# Patient Record
Sex: Male | Born: 1970 | Hispanic: No | Marital: Married | Smoking: Never smoker
Health system: Southern US, Community
[De-identification: ages and names within clinical notes are randomized; demographics above are authoritative.]

## PROBLEM LIST (undated history)

## (undated) DIAGNOSIS — F329 Major depressive disorder, single episode, unspecified: Secondary | ICD-10-CM

## (undated) DIAGNOSIS — N189 Chronic kidney disease, unspecified: Secondary | ICD-10-CM

## (undated) DIAGNOSIS — F32A Depression, unspecified: Secondary | ICD-10-CM

## (undated) DIAGNOSIS — K219 Gastro-esophageal reflux disease without esophagitis: Secondary | ICD-10-CM

## (undated) DIAGNOSIS — N201 Calculus of ureter: Secondary | ICD-10-CM

## (undated) HISTORY — DX: Major depressive disorder, single episode, unspecified: F32.9

## (undated) HISTORY — PX: HEMORRHOID SURGERY: SHX153

## (undated) HISTORY — DX: Chronic kidney disease, unspecified: N18.9

## (undated) HISTORY — DX: Depression, unspecified: F32.A

---

## 2014-11-08 ENCOUNTER — Emergency Department (HOSPITAL_BASED_OUTPATIENT_CLINIC_OR_DEPARTMENT_OTHER): Payer: Medicaid Other

## 2014-11-08 ENCOUNTER — Encounter (HOSPITAL_BASED_OUTPATIENT_CLINIC_OR_DEPARTMENT_OTHER): Payer: Self-pay | Admitting: *Deleted

## 2014-11-08 ENCOUNTER — Emergency Department (HOSPITAL_BASED_OUTPATIENT_CLINIC_OR_DEPARTMENT_OTHER)
Admission: EM | Admit: 2014-11-08 | Discharge: 2014-11-09 | Disposition: A | Discharge status: Home / Self Care | Payer: Medicaid Other | Attending: Emergency Medicine | Admitting: Emergency Medicine

## 2014-11-08 DIAGNOSIS — M25512 Pain in left shoulder: Secondary | ICD-10-CM | POA: Diagnosis present

## 2014-11-08 DIAGNOSIS — M5412 Radiculopathy, cervical region: Secondary | ICD-10-CM | POA: Insufficient documentation

## 2014-11-08 NOTE — ED Notes (Signed)
Pt c/o chest pain x 2 years worse x 1 day

## 2014-11-08 NOTE — ED Notes (Signed)
C/o left chest shoulder and arm pain x 2 years,  Getting worse, states pain is increased w movement,  States diff raising left arm at times due to pain

## 2014-11-08 NOTE — ED Provider Notes (Addendum)
CSN: [id number]     Ar the patient is not able to localize his pain to a specific dermatomerival date & time 11/08/14  2308 History  This chart was scribed for Paula LibraJohn Zadie Deemer, MD by Leona CarryG. Clay Sherrill, ED Scribe. The patient was seen in [facility]. The patient's care was started at 11:53 PM.     Chief Complaint  Patient presents with  . Shoulder Pain   HPI HPI Comments: Marc Tate is a 44 y.o. male who presents to the Emergency Department complaining of left shoulder pain beginning two years ago. Patient reports that the pain radiates to the left upper part of his chest, the left upper part of his neck and down his left arm.  pain is worse with movement of the left arm or movement of the neck. He describes the pain as feeling like muscle spasms. He states that the pain became more severe two weeks ago and is becoming progressively worse. Patient denies numbness in fingers.  [place].  History reviewed. No pertinent past medical history. Past Surgical History  Procedure Laterality Date  . Hemorrhoid surgery     History reviewed. No pertinent family history. History  Substance Use Topics  . Smoking status: Never Smoker   . Smokeless tobacco: Not on file  . Alcohol Use: No    Review of Systems  All other systems reviewed and are negative.      Allergies  Review of patient's allergies indicates no known allergies.  Home Medications   Prior to Admission medications   Not on File   Triage Vitals: BP 147/75 mmHg  Pulse 60  Temp(Src) 98.3 F (36.8 C)  Resp 18  Ht 5\' 8"  (1.727 m)  Wt 184 lb (83.462 kg)  BMI 27.98 kg/m2 Physical Exam General: Well-developed, well-nourished male in no acute distress; appearance consistent with age of record HENT: normocephalic; atraumatic Eyes: pupils equal, round and reactive to light; extraocular muscles intact Neck: supple; forward flexion or rotation to the right causes the pain Heart: regular rate  and rhythm; no murmurs, rubs or gallops Lungs: clear to auscultation bilaterally Abdomen: soft; nondistended; nontender; no masses or hepatosplenomegaly; bowel sounds present Extremities: No deformity; full range of motion; pulses normal; pain on range of motion of left shoulder, notably abduction and internal rotation  Neurologic: Awake, alert and oriented; motor function intact in all extremities and symmetric; sensation intact in the upper extremities and symmetric; no facial droop Skin: Warm and dry Psychiatric: Normal mood and affect     ED Course  Procedures (including critical care time) DIAGNOSTIC STUDIES:   COORDINATION OF CARE:  DDX includes cervical radiculopathy and rotator cuff syndrome. I favor a diagnosis of cervical radiculopathy even though the patient is not able to localize the pain to a specific dermatome.     EKG Interpretation   Date/Time:  Monday Nov 08 2014 23:21:44 EDT Ventricular Rate:  64 PR Interval:  134 QRS Duration: 94 QT Interval:  364 QTC Calculation: 375 R Axis:   31 Text Interpretation:  Normal sinus rhythm Normal ECG No previous ECGs  available Confirmed by Barnes Florek  MD, Jonny RuizJOHN ([id number]) on 11/08/2014 11:41:32 PM      MDM  Nursing notes and vitals signs, including pulse oximetry, reviewed.  Summary of this visit's results, reviewed by myself:  Imaging Studies: Dg Chest 2 View  11/09/2014   CLINICAL DATA:  Chronic chest pain, worse on the left. Pain radiates down the left arm and  back. Initial encounter.  EXAM: CHEST  2 VIEW  COMPARISON:  None.  FINDINGS: The lungs are well-aerated and clear. There is no evidence of focal opacification, pleural effusion or pneumothorax.  The heart is normal in size; the mediastinal contour is within normal limits. No acute osseous abnormalities are seen.  IMPRESSION: No acute cardiopulmonary process seen.   Electronically Signed   By: Roanna Raider M.D.   On: 11/09/2014 00:25   I personally performed the  services described in this documentation, which was scribed in my presence. The recorded information has been reviewed and is accurate.   Paula Libra, MD 11/09/14 1610  Paula Libra, MD 11/09/14 [id number]

## 2014-11-09 MED ORDER — CYCLOBENZAPRINE HCL 10 MG PO TABS: 10.0000 mg | ORAL_TABLET | Freq: Three times a day (TID) | ORAL | Status: DC | PRN

## 2014-11-09 MED ORDER — NAPROXEN 250 MG PO TABS
500.0000 mg | ORAL_TABLET | Freq: Once | ORAL | Status: AC
  Administered 2014-11-09: 500 mg via ORAL
  Filled 2014-11-09: qty 2

## 2014-11-09 MED ORDER — NAPROXEN SODIUM 550 MG PO TABS: ORAL_TABLET | ORAL | Status: DC

## 2014-11-09 MED ORDER — CYCLOBENZAPRINE HCL 10 MG PO TABS
10.0000 mg | ORAL_TABLET | Freq: Once | ORAL | Status: AC
  Administered 2014-11-09: 10 mg via ORAL
  Filled 2014-11-09: qty 1

## 2015-01-10 ENCOUNTER — Other Ambulatory Visit: Payer: Self-pay | Admitting: Infectious Disease

## 2015-01-10 ENCOUNTER — Ambulatory Visit
Admission: RE | Admit: 2015-01-10 | Discharge: 2015-01-10 | Disposition: A | Discharge status: Home / Self Care | Payer: No Typology Code available for payment source | Source: Ambulatory Visit | Attending: Infectious Disease | Admitting: Infectious Disease

## 2015-01-10 DIAGNOSIS — R7611 Nonspecific reaction to tuberculin skin test without active tuberculosis: Secondary | ICD-10-CM

## 2016-09-10 ENCOUNTER — Ambulatory Visit (INDEPENDENT_AMBULATORY_CARE_PROVIDER_SITE_OTHER): Payer: BLUE CROSS/BLUE SHIELD | Admitting: Family Medicine

## 2016-09-10 ENCOUNTER — Ambulatory Visit (INDEPENDENT_AMBULATORY_CARE_PROVIDER_SITE_OTHER): Payer: BLUE CROSS/BLUE SHIELD

## 2016-09-10 VITALS — BP 126/78 | HR 70 | Temp 97.8°F | Resp 14 | Ht 67.0 in | Wt 190.0 lb

## 2016-09-10 DIAGNOSIS — R1010 Upper abdominal pain, unspecified: Secondary | ICD-10-CM

## 2016-09-10 DIAGNOSIS — R079 Chest pain, unspecified: Secondary | ICD-10-CM

## 2016-09-10 DIAGNOSIS — Z789 Other specified health status: Secondary | ICD-10-CM | POA: Diagnosis not present

## 2016-09-10 DIAGNOSIS — Z87442 Personal history of urinary calculi: Secondary | ICD-10-CM

## 2016-09-10 DIAGNOSIS — R12 Heartburn: Secondary | ICD-10-CM

## 2016-09-10 DIAGNOSIS — R109 Unspecified abdominal pain: Secondary | ICD-10-CM

## 2016-09-10 DIAGNOSIS — F329 Major depressive disorder, single episode, unspecified: Secondary | ICD-10-CM

## 2016-09-10 DIAGNOSIS — N200 Calculus of kidney: Secondary | ICD-10-CM | POA: Diagnosis not present

## 2016-09-10 LAB — POCT URINALYSIS DIP (MANUAL ENTRY)
BILIRUBIN UA: NEGATIVE
BILIRUBIN UA: NEGATIVE
GLUCOSE UA: NEGATIVE
LEUKOCYTES UA: NEGATIVE
Nitrite, UA: NEGATIVE
Protein Ur, POC: NEGATIVE
SPEC GRAV UA: 1.025 (ref 1.030–1.035)
Urobilinogen, UA: 0.2 (ref ?–2.0)
pH, UA: 5.5 (ref 5.0–8.0)

## 2016-09-10 LAB — POC MICROSCOPIC URINALYSIS (UMFC): MUCUS RE: ABSENT

## 2016-09-10 MED ORDER — OMEPRAZOLE 20 MG PO CPDR: 20.0000 mg | DELAYED_RELEASE_CAPSULE | Freq: Every day | ORAL | 1 refills | Status: DC

## 2016-09-10 NOTE — Progress Notes (Signed)
By signing my name below, I, Marc Tate, attest that this documentation has been prepared under the direction and in the presence of Marc Staggers, MD.  Electronically Signed: [name], Medical Scribe. 09/10/16. 2:56 PM.  Subjective:    Patient ID: Marc Tate, male    DOB: 01-13-1971, 46 y.o.   MRN: [id number]  HPI Chief Complaint  Patient presents with  . Flank Pain  . Depression    states sometimes has depression    HPI Comments: Ruffner Marc is a 46 y.o. male who presents to the Primary Care at [facility] complaining of worsening intermittent left flank pain onset over 4 months, not felt today. Pt's native language is Arabic. He brought in his cousin to translate for him and stratus interpreter [phone] was used. Flank pain has been increasing in frequency and last longer when it occurs and worsens after standing without minimal movement during his 4 hour shift. Reports associated sxs of intermittent darker yellow urine (no blood), dysuria, chest pain onset for 8 years (not felt today), SOB (not during chest pain and not felt today). He mentions his SOB does not occur at the same time of his chest pain. Took ibuprofen, OTC gas relief PRN, and OTC colorful antiacids 1-2 x a week for some relief of his sxs. He reports GERD, and feeling gas moving in his abdomen and suspects he has gas in his GI. Pt had colonoscopy while under anesthesia. Pt was told he had muscle spasms located in his left upper lower abdomen and radiates to his chest area that feels similar to a pulsing "heart beat". Pt had a kidney sone 15 years ago and notes having a urine problem that was related to his prostate while in [place] - pt doesn't mention the onset, treatment, or other descriptive factors. He reports unrelated shoulder pain and hunger even after he's finished his meal. Denies hematuria, bloody stool, melena, fever, nausea, emesis, light-headedness, and PMHx/FHx of MI/heart  disease.  Depression: Pt had counseling with medication for 3 months when he was in [place] as a refugee and found a lot of relief of his dysphoric mood. Pt reports it has allowed him to go to work and to do his daily activities. Pt felt better after moving to the [place] but for the past 2 months after living here with a language barrier, relying on other people/not having his independence, and the cultural difference has caused his depression to come back. Denies SI, thoughts of self harm, or thoughts of hurting others.  Depression screen [facility] 2/9 09/10/2016 09/10/2016 09/10/2016  Decreased Interest 1 1 0  Down, Depressed, Hopeless 1 - 1  PHQ - 2 Score Altered sleeping 1 - -  Tired, decreased energy 1 - -  Change in appetite 1 - -  Feeling bad or failure about yourself  1 - -  Trouble concentrating 1 - -  Moving slowly or fidgety/restless 1 - -  Suicidal thoughts 0 - -  PHQ-9 Score 8 - -  Difficult doing work/chores Very difficult - -    There are no active problems to display for this patient.  No past medical history on file. No past surgical history on file. No Known Allergies Prior to Admission medications   Not on File   Social History   Social History  . Marital status: Married    Spouse name: N/A  . Number of children: N/A  . Years of education: N/A  Occupational History  . Not on file.   Social History Main Topics  . Smoking status: Never Smoker  . Smokeless tobacco: Never Used  . Alcohol use Not on file  . Drug use: Unknown  . Sexual activity: Not on file   Other Topics Concern  . Not on file   Social History Narrative  . No narrative on file   Review of Systems  Constitutional: Negative for fever.  Respiratory: Positive for shortness of breath.   Cardiovascular: Positive for chest pain.  Gastrointestinal: Positive for abdominal distention. Negative for abdominal pain, blood in stool, nausea and vomiting.  Genitourinary: Positive for dysuria and  flank pain. Negative for hematuria.  Musculoskeletal: Positive for arthralgias.  Neurological: Negative for light-headedness.  Psychiatric/Behavioral: Positive for dysphoric mood. Negative for self-injury and suicidal ideas.   Objective:  Physical Exam  Constitutional: He is oriented to person, place, and time. He appears well-developed and well-nourished.  HENT:  Head: Normocephalic and atraumatic.  Eyes: EOM are normal. Pupils are equal, round, and reactive to light.  Neck: No JVD present. Carotid bruit is not present.  Cardiovascular: Normal rate, regular rhythm and normal heart sounds.  Exam reveals no gallop and no friction rub.   No murmur heard. Pulmonary/Chest: Effort normal and breath sounds normal. No respiratory distress. He has no wheezes. He has no rales. He exhibits no tenderness.  Abdominal: Soft. Bowel sounds are normal. There is tenderness (minimal) in the left lower quadrant. There is no CVA tenderness, no tenderness at McBurney's point and negative Murphy's sign.  Musculoskeletal: He exhibits no edema.  Skin intact No rash  Neurological: He is alert and oriented to person, place, and time.  Skin: Skin is warm, dry and intact. No rash noted.  Psychiatric: He has a normal mood and affect.  Vitals reviewed.   Vitals:   09/10/16 1410  BP: 126/78  Pulse: 70  Resp: 14  Temp: 97.8 F (36.6 C)  SpO2: 100%  Weight: 190 lb (86.2 kg)  Height: 5\' 7"  (1.702 m)  Body mass index is 29.76 kg/m.   Results for orders placed or performed in visit on 09/10/16  POCT urinalysis dipstick  Result Value Ref Range   Color, UA yellow yellow   Clarity, UA clear clear   Glucose, UA negative negative   Bilirubin, UA negative negative   Ketones, POC UA negative negative   Spec Grav, UA 1.025 1.030 - 1.035   Blood, UA trace-intact (A) negative   pH, UA 5.5 5.0 - 8.0   Protein Ur, POC negative negative   Urobilinogen, UA 0.2 Negative - 2.0   Nitrite, UA Negative Negative    Leukocytes, UA Negative Negative  POCT Microscopic Urinalysis ([facility])  Result Value Ref Range   WBC,UR,HPF,POC None None WBC/hpf   RBC,UR,HPF,POC None None RBC/hpf   Bacteria None None, Too numerous to count   Mucus Absent Absent   Epithelial Cells, UR Per Microscopy None None, Too numerous to count cells/hpf   Dg Abd 1 View  Result Date: 09/10/2016 CLINICAL DATA:  Initial evaluation for acute left flank pain. EXAM: ABDOMEN - 1 VIEW COMPARISON:  None. FINDINGS: Bowel gas pattern within normal limits without evidence for obstruction or ileus. Faint calcific density measuring approximately 8 mm overlies the region of the left renal shadow, which may reflect nephrolithiasis. No other radiopaque calcifications seen overlying either kidney or along the expected course of either ureter. There is a 5 mm calcific density overlying the left aspect of the bladder  in the left hemipelvis. While this may reflect a vascular phlebolith. Possible small stone near the left UVJ is not entirely excluded. Adjacent calcification just laterally favored to be vascular in nature as this lies outside the bladder shadow. Visualized osseous structures within normal limits. IMPRESSION: 1. 5 mm calcific density overlying the left aspect of the bladder. While this may reflect a vascular phlebolith, possible stone positioned near the left UVJ could also be considered. 2. Amorphous 8 mm calcific density overlying the left renal shadow, suspicious for left renal nephrolithiasis. 3. Nonobstructive bowel gas pattern. Electronically Signed   By: Rise Mu M.D.   On: 09/10/2016 15:56   Assessment & Plan:    Bjorn Hallas is a 46 y.o. male Flank pain - Plan: POCT urinalysis dipstick, POCT Microscopic Urinalysis ([facility]), DG Abd 1 View, Ambulatory referral to Urology Left flank pain - Plan: DG Abd 1 View History of nephrolithiasis - Plan: DG Abd 1 View, Ambulatory referral to Urology Nephrolith - Plan: Ambulatory referral to  Urology  - Nephrolith seen on x-ray, urinalysis with possible blood, but not seen on micro-. Minimal symptoms at present, doubt obstructive nephrolith.  - Fluids, refer to urology for further evaluation, but RTC/ER precautions discussed if acute worsening pain that may represent obstructive nephrolith  Language barrier  - Video interpreter used with understanding expressed  Chest pain, unspecified type - Plan: EKG 12-Lead  - Rare intermittent symptoms. May be related to heartburn. Trial of omeprazole 20 mg daily, but ER/RTC precautions discussed for chest pain.  Heartburn - Plan: omeprazole (PRILOSEC) 20 MG capsule Upper abdominal pain - Plan: Comprehensive metabolic panel, Lipase, omeprazole (PRILOSEC) 20 MG capsule, CBC  - Check CMP, lipase, CBC. Start PPI. If symptoms persist, may need CT imaging for both nephrolith and to  evaluate abdominal pain further. Also consider gastroenterology eval if persistent lower abdominal pain.  Reactive depression  - Previous depression, now with return of symptoms past few months. Suspect situational with culture change, language barrier and social stressors. Number provided for counseling.  Requested repeat visit in 1 week to recheck some of symptoms above and to assess improvement.   Meds ordered this encounter  Medications  . omeprazole (PRILOSEC) 20 MG capsule    Sig: Take 1 capsule (20 mg total) by mouth daily.    Dispense:  30 capsule    Refill:  1   Patient Instructions   Your x-ray does indicate likely kidney stones. This may cause flank pain and abdominal pain, so we'll refer you to a urologist to discuss treatment of those stones. If you have acute change or worsening of that pain, return here or the emergency room.  Start prilosec once per day to see if that helps possible heartburn, chest symptoms and abdominal pain. I will check some bloodwork to look at liver and pancreas tests, and recheck in next 1 week.  If pain is not improving in  next week with heartburn medicine, you may need to have cat scan to look at causes of pain further.  Return to the clinic or go to the nearest emergency room if any of your symptoms worsen or new symptoms occur.  Please return to discuss chest pains further, but if from heartburn - that should also improve with prilosec once per day. Return to the clinic or go to the nearest emergency room if any of your symptoms worsen or new symptoms occur.  For depression symptoms, would recommend meeting with a counselor. You may also need to  be back on medication, but would like to try counseling first. Please follow up to discuss this further in next few weeks.  Counseling:  [facility] [place]: [phone]   Kidney Stones Kidney stones (urolithiasis) are solid, rock-like deposits that form inside of the organs that make urine (kidneys). A kidney stone may form in a kidney and move into the bladder, where it can cause intense pain and block the flow of urine. Kidney stones are created when high levels of certain minerals are found in the urine. They are usually passed through urination, but in some cases, medical treatment may be needed to remove them. What are the causes? Kidney stones may be caused by:  A condition in which certain glands produce too much parathyroid hormone (primary hyperparathyroidism), which causes too much calcium buildup in the blood.  Buildup of uric acid crystals in the bladder (hyperuricosuria). Uric acid is a chemical that the body produces when you eat certain foods. It usually exits the body in the urine.  Narrowing (stricture) of one or both of the tubes that drain urine from the kidneys to the bladder (ureters).  A kidney blockage that is present at birth (congenital obstruction).  Past surgery on the kidney or the ureters, such as gastric bypass surgery. What increases the risk? The following factors make you more likely to develop kidney  stones:  Having had a kidney stone in the past.  Having a family history of kidney stones.  Not drinking enough water.  Eating a diet that is high in protein, salt (sodium), or sugar.  Being overweight or obese. What are the signs or symptoms? Symptoms of a kidney stone may include:  Nausea.  Vomiting.  Blood in the urine (hematuria).  Pain in the side of the abdomen, right below the ribs (flank pain). Pain usually spreads (radiates) to the groin.  Needing to urinate frequently or urgently. How is this diagnosed? This condition may be diagnosed based on:  Your medical history.  A physical exam.  Blood tests.  Urine tests.  CT scan.  Abdominal X-ray.  A procedure to examine the inside of the bladder (cystoscopy). How is this treated? Treatment for kidney stones depends on the size, location, and makeup of the stones. Treatment may involve:  Analyzing your urine before and after you pass the stone through urination.  Being monitored at the hospital until you pass the stone through urination.  Increasing your fluid intake and decreasing the amount of calcium and protein in your diet.  A procedure to break up kidney stones in the bladder using:  A focused beam of light (laser therapy).  Shock waves (extracorporeal shock wave lithotripsy).  Surgery to remove kidney stones. This may be needed if you have severe pain or have stones that block your urinary tract. Follow these instructions at home: Eating and drinking    Drink enough fluid to keep your urine clear or pale yellow. This will help you to pass the kidney stone.  If directed, change your diet. This may include:  Limiting how much sodium you eat.  Eating more fruits and vegetables.  Limiting how much meat, poultry, fish, and eggs you eat.  Follow instructions from your health care provider about eating or drinking restrictions. General instructions   Collect urine samples as told by your  health care provider. You may need to collect a urine sample:  24 hours after you pass the stone.  8-12 weeks after passing the kidney stone, and every  6-12 months after that.  Strain your urine every time you urinate, for as long as directed. Use the strainer that your health care provider recommends.  Do not throw out the kidney stone after passing it. Keep the stone so it can be tested by your health care provider. Testing the makeup of your kidney stone may help prevent you from getting kidney stones in the future.  Take over-the-counter and prescription medicines only as told by your health care provider.  Keep all follow-up visits as told by your health care provider. This is important. You may need follow-up X-rays or ultrasounds to make sure that your stone has passed. How is this prevented? To prevent another kidney stone:  Drink enough fluid to keep your urine clear or pale yellow. This is the best way to prevent kidney stones.  Eat a healthy diet and follow recommendations from your health care provider about foods to avoid. You may be instructed to eat a low-protein diet. Recommendations vary depending on the type of kidney stone that you have.  Maintain a healthy weight. Contact a health care provider if:  You have pain that gets worse or does not get better with medicine. Get help right away if:  You have a fever or chills.  You develop severe pain.  You develop new abdominal pain.  You faint.  You are unable to urinate. This information is not intended to replace advice given to you by your health care provider. Make sure you discuss any questions you have with your health care provider. Document Released: 05/28/2005 Document Revised: 12/16/2015 Document Reviewed: 11/11/2015 Elsevier Interactive Patient Education  2017 Elsevier Inc.   Nonspecific Chest Pain Chest pain can be caused by many different conditions. There is always a chance that your pain could be  related to something serious, such as a heart attack or a blood clot in your lungs. Chest pain can also be caused by conditions that are not life-threatening. If you have chest pain, it is very important to follow up with your health care provider. What are the causes? Causes of this condition include:  Heartburn.  Pneumonia or bronchitis.  Anxiety or stress.  Inflammation around your heart (pericarditis) or lung (pleuritis or pleurisy).  A blood clot in your lung.  A collapsed lung (pneumothorax). This can develop suddenly on its own (spontaneous pneumothorax) or from trauma to the chest.  Shingles infection (varicella-zoster virus).  Heart attack.  Damage to the bones, muscles, and cartilage that make up your chest wall. This can include:  Bruised bones due to injury.  Strained muscles or cartilage due to frequent or repeated coughing or overwork.  Fracture to one or more ribs.  Sore cartilage due to inflammation (costochondritis). What increases the risk? Risk factors for this condition may include:  Activities that increase your risk for trauma or injury to your chest.  Respiratory infections or conditions that cause frequent coughing.  Medical conditions or overeating that can cause heartburn.  Heart disease or family history of heart disease.  Conditions or health behaviors that increase your risk of developing a blood clot.  Having had chicken pox (varicella zoster). What are the signs or symptoms? Chest pain can feel like:  Burning or tingling on the surface of your chest or deep in your chest.  Crushing, pressure, aching, or squeezing pain.  Dull or sharp pain that is worse when you move, cough, or take a deep breath.  Pain that is also felt in your back, neck,  shoulder, or arm, or pain that spreads to any of these areas. Your chest pain may come and go, or it may stay constant. How is this diagnosed? Lab tests or other studies may be needed to find the  cause of your pain. Your health care provider may have you take a test called an ECG (electrocardiogram). An ECG records your heartbeat patterns at the time the test is performed. You may also have other tests, such as:  Transthoracic echocardiogram (TTE). In this test, sound waves are used to create a picture of the heart structures and to look at how blood flows through your heart.  Transesophageal echocardiogram (TEE).This is a more advanced imaging test that takes images from inside your body. It allows your health care provider to see your heart in finer detail.  Cardiac monitoring. This allows your health care provider to monitor your heart rate and rhythm in real time.  Holter monitor. This is a portable device that records your heartbeat and can help to diagnose abnormal heartbeats. It allows your health care provider to track your heart activity for several days, if needed.  Stress tests. These can be done through exercise or by taking medicine that makes your heart beat more quickly.  Blood tests.  Other imaging tests. How is this treated? Treatment depends on what is causing your chest pain. Treatment may include:  Medicines. These may include:  Acid blockers for heartburn.  Anti-inflammatory medicine.  Pain medicine for inflammatory conditions.  Antibiotic medicine, if an infection is present.  Medicines to dissolve blood clots.  Medicines to treat coronary artery disease (CAD).  Supportive care for conditions that do not require medicines. This may include:  Resting.  Applying heat or cold packs to injured areas.  Limiting activities until pain decreases. Follow these instructions at home: Medicines   If you were prescribed an antibiotic, take it as told by your health care provider. Do not stop taking the antibiotic even if you start to feel better.  Take over-the-counter and prescription medicines only as told by your health care provider. Lifestyle   Do  not use any products that contain nicotine or tobacco, such as cigarettes and e-cigarettes. If you need help quitting, ask your health care provider.  Do not drink alcohol.  Make lifestyle changes as directed by your health care provider. These may include:  Getting regular exercise. Ask your health care provider to suggest some activities that are safe for you.  Eating a heart-healthy diet. A registered dietitian can help you to learn healthy eating options.  Maintaining a healthy weight.  Managing diabetes, if necessary.  Reducing stress, such as with yoga or relaxation techniques. General instructions   Avoid any activities that bring on chest pain.  If heartburn is the cause for your chest pain, raise (elevate) the head of your bed about 6 inches (15 cm) by putting blocks under the legs. Sleeping with more pillows does not effectively relieve heartburn because it only changes the position of your head.  Keep all follow-up visits as told by your health care provider. This is important. This includes any further testing if your chest pain does not go away. Contact a health care provider if:  Your chest pain does not go away.  You have a rash with blisters on your chest.  You have a fever.  You have chills. Get help right away if:  Your chest pain is worse.  You have a cough that gets worse, or you cough up  blood.  You have severe pain in your abdomen.  You have severe weakness.  You faint.  You have sudden, unexplained chest discomfort.  You have sudden, unexplained discomfort in your arms, back, neck, or jaw.  You have shortness of breath at any time.  You suddenly start to sweat, or your skin gets clammy.  You feel nauseous or you vomit.  You suddenly feel light-headed or dizzy.  Your heart begins to beat quickly, or it feels like it is skipping beats. These symptoms may represent a serious problem that is an emergency. Do not wait to see if the symptoms  will go away. Get medical help right away. Call your local emergency services (911 in the [place] Do not drive yourself to the hospital. This information is not intended to replace advice given to you by your health care provider. Make sure you discuss any questions you have with your health care provider. Document Released: 03/07/2005 Document Revised: 02/20/2016 Document Reviewed: 02/20/2016 Elsevier Interactive Patient Education  2017 Elsevier Inc.    Abdominal Pain, Adult Abdominal pain can be caused by many things. Often, abdominal pain is not serious and it gets better with no treatment or by being treated at home. However, sometimes abdominal pain is serious. Your health care provider will do a medical history and a physical exam to try to determine the cause of your abdominal pain. Follow these instructions at home:  Take over-the-counter and prescription medicines only as told by your health care provider. Do not take a laxative unless told by your health care provider.  Drink enough fluid to keep your urine clear or pale yellow.  Watch your condition for any changes.  Keep all follow-up visits as told by your health care provider. This is important. Contact a health care provider if:  Your abdominal pain changes or gets worse.  You are not hungry or you lose weight without trying.  You are constipated or have diarrhea for more than 2-3 days.  You have pain when you urinate or have a bowel movement.  Your abdominal pain wakes you up at night.  Your pain gets worse with meals, after eating, or with certain foods.  You are throwing up and cannot keep anything down.  You have a fever. Get help right away if:  Your pain does not go away as soon as your health care provider told you to expect.  You cannot stop throwing up.  Your pain is only in areas of the abdomen, such as the right side or the left lower portion of the abdomen.  You have bloody or black stools, or  stools that look like tar.  You have severe pain, cramping, or bloating in your abdomen.  You have signs of dehydration, such as:  Dark urine, very little urine, or no urine.  Cracked lips.  Dry mouth.  Sunken eyes.  Sleepiness.  Weakness. This information is not intended to replace advice given to you by your health care provider. Make sure you discuss any questions you have with your health care provider. Document Released: 03/07/2005 Document Revised: 12/16/2015 Document Reviewed: 11/09/2015 Elsevier Interactive Patient Education  2017 [employer].   IF you received an x-ray today, you will receive an invoice from [facility]. Please contact [facility] at [phone] with questions or concerns regarding your invoice.   IF you received labwork today, you will receive an invoice from [facility]. Please contact [facility] at [phone] with questions or concerns regarding your invoice.   Our  billing staff will not be able to assist you with questions regarding bills from these companies.  You will be contacted with the lab results as soon as they are available. The fastest way to get your results is to activate your My Chart account. Instructions are located on the last page of this paperwork. If you have not heard from [place] regarding the results in 2 weeks, please contact this office.      I personally performed the services described in this documentation, which was scribed in my presence. The recorded information has been reviewed and considered for accuracy and completeness, addended by me as needed, and agree with information above.  Signed,   Marc Staggers, MD [facility].  09/10/16 6:34 PM

## 2016-09-10 NOTE — Patient Instructions (Addendum)
Your x-ray does indicate likely kidney stones. This may cause flank pain and abdominal pain, so we'll refer you to a urologist to discuss treatment of those stones. If you have acute change or worsening of that pain, return here or the emergency room.  Start prilosec once per day to see if that helps possible heartburn, chest symptoms and abdominal pain. I will check some bloodwork to look at liver and pancreas tests, and recheck in next 1 week.  If pain is not improving in next week with heartburn medicine, you may need to have cat scan to look at causes of pain further.  Return to the clinic or go to the nearest emergency room if any of your symptoms worsen or new symptoms occur.  Please return to discuss chest pains further, but if from heartburn - that should also improve with prilosec once per day. Return to the clinic or go to the nearest emergency room if any of your symptoms worsen or new symptoms occur.  For depression symptoms, would recommend meeting with a counselor. You may also need to be back on medication, but would like to try counseling first. Please follow up to discuss this further in next few weeks.  Counseling:  [facility] [place]: [phone]   Kidney Stones Kidney stones (urolithiasis) are solid, rock-like deposits that form inside of the organs that make urine (kidneys). A kidney stone may form in a kidney and move into the bladder, where it can cause intense pain and block the flow of urine. Kidney stones are created when high levels of certain minerals are found in the urine. They are usually passed through urination, but in some cases, medical treatment may be needed to remove them. What are the causes? Kidney stones may be caused by:  A condition in which certain glands produce too much parathyroid hormone (primary hyperparathyroidism), which causes too much calcium buildup in the blood.  Buildup of uric acid crystals in the bladder (hyperuricosuria).  Uric acid is a chemical that the body produces when you eat certain foods. It usually exits the body in the urine.  Narrowing (stricture) of one or both of the tubes that drain urine from the kidneys to the bladder (ureters).  A kidney blockage that is present at birth (congenital obstruction).  Past surgery on the kidney or the ureters, such as gastric bypass surgery. What increases the risk? The following factors make you more likely to develop kidney stones:  Having had a kidney stone in the past.  Having a family history of kidney stones.  Not drinking enough water.  Eating a diet that is high in protein, salt (sodium), or sugar.  Being overweight or obese. What are the signs or symptoms? Symptoms of a kidney stone may include:  Nausea.  Vomiting.  Blood in the urine (hematuria).  Pain in the side of the abdomen, right below the ribs (flank pain). Pain usually spreads (radiates) to the groin.  Needing to urinate frequently or urgently. How is this diagnosed? This condition may be diagnosed based on:  Your medical history.  A physical exam.  Blood tests.  Urine tests.  CT scan.  Abdominal X-ray.  A procedure to examine the inside of the bladder (cystoscopy). How is this treated? Treatment for kidney stones depends on the size, location, and makeup of the stones. Treatment may involve:  Analyzing your urine before and after you pass the stone through urination.  Being monitored at the hospital until you pass the stone  through urination.  Increasing your fluid intake and decreasing the amount of calcium and protein in your diet.  A procedure to break up kidney stones in the bladder using:  A focused beam of light (laser therapy).  Shock waves (extracorporeal shock wave lithotripsy).  Surgery to remove kidney stones. This may be needed if you have severe pain or have stones that block your urinary tract. Follow these instructions at home: Eating and  drinking    Drink enough fluid to keep your urine clear or pale yellow. This will help you to pass the kidney stone.  If directed, change your diet. This may include:  Limiting how much sodium you eat.  Eating more fruits and vegetables.  Limiting how much meat, poultry, fish, and eggs you eat.  Follow instructions from your health care provider about eating or drinking restrictions. General instructions   Collect urine samples as told by your health care provider. You may need to collect a urine sample:  24 hours after you pass the stone.  8-12 weeks after passing the kidney stone, and every 6-12 months after that.  Strain your urine every time you urinate, for as long as directed. Use the strainer that your health care provider recommends.  Do not throw out the kidney stone after passing it. Keep the stone so it can be tested by your health care provider. Testing the makeup of your kidney stone may help prevent you from getting kidney stones in the future.  Take over-the-counter and prescription medicines only as told by your health care provider.  Keep all follow-up visits as told by your health care provider. This is important. You may need follow-up X-rays or ultrasounds to make sure that your stone has passed. How is this prevented? To prevent another kidney stone:  Drink enough fluid to keep your urine clear or pale yellow. This is the best way to prevent kidney stones.  Eat a healthy diet and follow recommendations from your health care provider about foods to avoid. You may be instructed to eat a low-protein diet. Recommendations vary depending on the type of kidney stone that you have.  Maintain a healthy weight. Contact a health care provider if:  You have pain that gets worse or does not get better with medicine. Get help right away if:  You have a fever or chills.  You develop severe pain.  You develop new abdominal pain.  You faint.  You are unable to  urinate. This information is not intended to replace advice given to you by your health care provider. Make sure you discuss any questions you have with your health care provider. Document Released: 05/28/2005 Document Revised: 12/16/2015 Document Reviewed: 11/11/2015 Elsevier Interactive Patient Education  2017 [employer]   Nonspecific Chest Pain Chest pain can be caused by many different conditions. There is always a chance that your pain could be related to something serious, such as a heart attack or a blood clot in your lungs. Chest pain can also be caused by conditions that are not life-threatening. If you have chest pain, it is very important to follow up with your health care provider. What are the causes? Causes of this condition include:  Heartburn.  Pneumonia or bronchitis.  Anxiety or stress.  Inflammation around your heart (pericarditis) or lung (pleuritis or pleurisy).  A blood clot in your lung.  A collapsed lung (pneumothorax). This can develop suddenly on its own (spontaneous pneumothorax) or from trauma to the chest.  Shingles infection (varicella-zoster  virus).  Heart attack.  Damage to the bones, muscles, and cartilage that make up your chest wall. This can include:  Bruised bones due to injury.  Strained muscles or cartilage due to frequent or repeated coughing or overwork.  Fracture to one or more ribs.  Sore cartilage due to inflammation (costochondritis). What increases the risk? Risk factors for this condition may include:  Activities that increase your risk for trauma or injury to your chest.  Respiratory infections or conditions that cause frequent coughing.  Medical conditions or overeating that can cause heartburn.  Heart disease or family history of heart disease.  Conditions or health behaviors that increase your risk of developing a blood clot.  Having had chicken pox (varicella zoster). What are the signs or symptoms? Chest pain  can feel like:  Burning or tingling on the surface of your chest or deep in your chest.  Crushing, pressure, aching, or squeezing pain.  Dull or sharp pain that is worse when you move, cough, or take a deep breath.  Pain that is also felt in your back, neck, shoulder, or arm, or pain that spreads to any of these areas. Your chest pain may come and go, or it may stay constant. How is this diagnosed? Lab tests or other studies may be needed to find the cause of your pain. Your health care provider may have you take a test called an ECG (electrocardiogram). An ECG records your heartbeat patterns at the time the test is performed. You may also have other tests, such as:  Transthoracic echocardiogram (TTE). In this test, sound waves are used to create a picture of the heart structures and to look at how blood flows through your heart.  Transesophageal echocardiogram (TEE).This is a more advanced imaging test that takes images from inside your body. It allows your health care provider to see your heart in finer detail.  Cardiac monitoring. This allows your health care provider to monitor your heart rate and rhythm in real time.  Holter monitor. This is a portable device that records your heartbeat and can help to diagnose abnormal heartbeats. It allows your health care provider to track your heart activity for several days, if needed.  Stress tests. These can be done through exercise or by taking medicine that makes your heart beat more quickly.  Blood tests.  Other imaging tests. How is this treated? Treatment depends on what is causing your chest pain. Treatment may include:  Medicines. These may include:  Acid blockers for heartburn.  Anti-inflammatory medicine.  Pain medicine for inflammatory conditions.  Antibiotic medicine, if an infection is present.  Medicines to dissolve blood clots.  Medicines to treat coronary artery disease (CAD).  Supportive care for conditions that  do not require medicines. This may include:  Resting.  Applying heat or cold packs to injured areas.  Limiting activities until pain decreases. Follow these instructions at home: Medicines   If you were prescribed an antibiotic, take it as told by your health care provider. Do not stop taking the antibiotic even if you start to feel better.  Take over-the-counter and prescription medicines only as told by your health care provider. Lifestyle   Do not use any products that contain nicotine or tobacco, such as cigarettes and e-cigarettes. If you need help quitting, ask your health care provider.  Do not drink alcohol.  Make lifestyle changes as directed by your health care provider. These may include:  Getting regular exercise. Ask your health care provider to  suggest some activities that are safe for you.  Eating a heart-healthy diet. A registered dietitian can help you to learn healthy eating options.  Maintaining a healthy weight.  Managing diabetes, if necessary.  Reducing stress, such as with yoga or relaxation techniques. General instructions   Avoid any activities that bring on chest pain.  If heartburn is the cause for your chest pain, raise (elevate) the head of your bed about 6 inches (15 cm) by putting blocks under the legs. Sleeping with more pillows does not effectively relieve heartburn because it only changes the position of your head.  Keep all follow-up visits as told by your health care provider. This is important. This includes any further testing if your chest pain does not go away. Contact a health care provider if:  Your chest pain does not go away.  You have a rash with blisters on your chest.  You have a fever.  You have chills. Get help right away if:  Your chest pain is worse.  You have a cough that gets worse, or you cough up blood.  You have severe pain in your abdomen.  You have severe weakness.  You faint.  You have sudden,  unexplained chest discomfort.  You have sudden, unexplained discomfort in your arms, back, neck, or jaw.  You have shortness of breath at any time.  You suddenly start to sweat, or your skin gets clammy.  You feel nauseous or you vomit.  You suddenly feel light-headed or dizzy.  Your heart begins to beat quickly, or it feels like it is skipping beats. These symptoms may represent a serious problem that is an emergency. Do not wait to see if the symptoms will go away. Get medical help right away. Call your local emergency services (911 in the [place] Do not drive yourself to the hospital. This information is not intended to replace advice given to you by your health care provider. Make sure you discuss any questions you have with your health care provider. Document Released: 03/07/2005 Document Revised: 02/20/2016 Document Reviewed: 02/20/2016 Elsevier Interactive Patient Education  2017 Elsevier Inc.    Abdominal Pain, Adult Abdominal pain can be caused by many things. Often, abdominal pain is not serious and it gets better with no treatment or by being treated at home. However, sometimes abdominal pain is serious. Your health care provider will do a medical history and a physical exam to try to determine the cause of your abdominal pain. Follow these instructions at home:  Take over-the-counter and prescription medicines only as told by your health care provider. Do not take a laxative unless told by your health care provider.  Drink enough fluid to keep your urine clear or pale yellow.  Watch your condition for any changes.  Keep all follow-up visits as told by your health care provider. This is important. Contact a health care provider if:  Your abdominal pain changes or gets worse.  You are not hungry or you lose weight without trying.  You are constipated or have diarrhea for more than 2-3 days.  You have pain when you urinate or have a bowel movement.  Your abdominal  pain wakes you up at night.  Your pain gets worse with meals, after eating, or with certain foods.  You are throwing up and cannot keep anything down.  You have a fever. Get help right away if:  Your pain does not go away as soon as your health care provider told you to expect.  You cannot stop throwing up.  Your pain is only in areas of the abdomen, such as the right side or the left lower portion of the abdomen.  You have bloody or black stools, or stools that look like tar.  You have severe pain, cramping, or bloating in your abdomen.  You have signs of dehydration, such as:  Dark urine, very little urine, or no urine.  Cracked lips.  Dry mouth.  Sunken eyes.  Sleepiness.  Weakness. This information is not intended to replace advice given to you by your health care provider. Make sure you discuss any questions you have with your health care provider. Document Released: 03/07/2005 Document Revised: 12/16/2015 Document Reviewed: 11/09/2015 Elsevier Interactive Patient Education  2017 [employer].   IF you received an x-ray today, you will receive an invoice from [facility]. Please contact [facility] at [phone] with questions or concerns regarding your invoice.   IF you received labwork today, you will receive an invoice from [facility]. Please contact [facility] at [phone] with questions or concerns regarding your invoice.   Our billing staff will not be able to assist you with questions regarding bills from these companies.  You will be contacted with the lab results as soon as they are available. The fastest way to get your results is to activate your My Chart account. Instructions are located on the last page of this paperwork. If you have not heard from [place] regarding the results in 2 weeks, please contact this office.

## 2016-09-11 LAB — CBC
Hematocrit: 39 % (ref 37.5–51.0)
Hemoglobin: 13.7 g/dL (ref 13.0–17.7)
MCH: 30 pg (ref 26.6–33.0)
MCHC: 35.1 g/dL (ref 31.5–35.7)
MCV: 86 fL (ref 79–97)
PLATELETS: 244 10*3/uL (ref 150–379)
RBC: 4.56 x10E6/uL (ref 4.14–5.80)
RDW: 13.8 % (ref 12.3–15.4)
WBC: 4.8 10*3/uL (ref 3.4–10.8)

## 2016-09-11 LAB — COMPREHENSIVE METABOLIC PANEL
A/G RATIO: 1.8 (ref 1.2–2.2)
ALK PHOS: 56 IU/L (ref 39–117)
ALT: 26 IU/L (ref 0–44)
AST: 23 IU/L (ref 0–40)
Albumin: 4.8 g/dL (ref 3.5–5.5)
BILIRUBIN TOTAL: 0.4 mg/dL (ref 0.0–1.2)
BUN/Creatinine Ratio: 21 — ABNORMAL HIGH (ref 9–20)
BUN: 14 mg/dL (ref 6–24)
CALCIUM: 9.6 mg/dL (ref 8.7–10.2)
CHLORIDE: 99 mmol/L (ref 96–106)
CO2: 26 mmol/L (ref 18–29)
Creatinine, Ser: 0.68 mg/dL — ABNORMAL LOW (ref 0.76–1.27)
GFR calc Af Amer: 134 mL/min/{1.73_m2} (ref >59)
GFR, EST NON AFRICAN AMERICAN: 116 mL/min/{1.73_m2} (ref >59)
Globulin, Total: 2.6 g/dL (ref 1.5–4.5)
Glucose: 105 mg/dL — ABNORMAL HIGH (ref 65–99)
POTASSIUM: 4 mmol/L (ref 3.5–5.2)
Sodium: 141 mmol/L (ref 134–144)
Total Protein: 7.4 g/dL (ref 6.0–8.5)

## 2016-09-11 LAB — LIPASE: LIPASE: 18 U/L (ref 13–78)

## 2016-09-17 ENCOUNTER — Ambulatory Visit (INDEPENDENT_AMBULATORY_CARE_PROVIDER_SITE_OTHER): Payer: BLUE CROSS/BLUE SHIELD | Admitting: Family Medicine

## 2016-09-17 VITALS — BP 122/73 | HR 67 | Temp 98.5°F | Resp 16 | Ht 66.5 in | Wt 190.2 lb

## 2016-09-17 DIAGNOSIS — R079 Chest pain, unspecified: Secondary | ICD-10-CM | POA: Diagnosis not present

## 2016-09-17 DIAGNOSIS — R3911 Hesitancy of micturition: Secondary | ICD-10-CM | POA: Diagnosis not present

## 2016-09-17 DIAGNOSIS — Z789 Other specified health status: Secondary | ICD-10-CM

## 2016-09-17 DIAGNOSIS — N200 Calculus of kidney: Secondary | ICD-10-CM | POA: Diagnosis not present

## 2016-09-17 DIAGNOSIS — R109 Unspecified abdominal pain: Secondary | ICD-10-CM | POA: Diagnosis not present

## 2016-09-17 LAB — POC MICROSCOPIC URINALYSIS (UMFC): MUCUS RE: ABSENT

## 2016-09-17 LAB — POCT URINALYSIS DIP (MANUAL ENTRY)
BILIRUBIN UA: NEGATIVE
BILIRUBIN UA: NEGATIVE
Blood, UA: NEGATIVE
Glucose, UA: NEGATIVE
LEUKOCYTES UA: NEGATIVE
NITRITE UA: NEGATIVE
PH UA: 6 (ref 5.0–8.0)
PROTEIN UA: NEGATIVE
Spec Grav, UA: 1.025 (ref 1.030–1.035)
Urobilinogen, UA: 0.2 (ref ?–2.0)

## 2016-09-17 MED ORDER — HYDROCODONE-ACETAMINOPHEN 5-325 MG PO TABS: 1.0000 | ORAL_TABLET | Freq: Four times a day (QID) | ORAL | 0 refills | Status: DC | PRN

## 2016-09-17 NOTE — Progress Notes (Addendum)
By signing my name below, I, [name], attest that this documentation has been prepared under the direction and in theMeredith Staggersf Berdella Bacot, MD.  Electronically Signed: [name], Medical Scribe. 09/17/16. 11:07 AM.  Subjective:    Patient ID: Marc Tate, male    DOB: 01/02/1971, 46 y.o.   MRN: [id number]  HPI Chief Complaint  Patient presents with  . Follow-up    FLANK PAIN    HPI Comments: Marc Tate is a 46 y.o. male who presents to the Primary Care at [facility] and [facility] for flank pain follow-up. Pt's native language is Arabic so [place] interpreter, Marc Tate [phone], was used.  Flank Pain: He was last seen 1 week ago with multiple concerns. Thought to have possible nephrolithiasis at that time. Advised to start fluids and advised to follow-up with urology for eval, but as improving, advised to follow-up if worsening sxs. Continues to have intermittent left back pain that wraps around his left flank but it has improved since his last visit. Takes ibuprofen for little relief of his sxs. Pt is having "the same color urine as he used to have" and reports difficulty urinating stating he "has to take time to get his urine out" onset yesterday - has not been able to pass the stone since his last visit. Pt has not received a phone call from the urologist. When living in [place], he was told he could have a problem with his prostate but the testing done showed nl results. Denies hematuria, dysuria, fever.  Radiating Chest Pain: Reports muscle spasms located at his left shoulder to his medial chest for the past 3 years that occasionally cause trouble breathing preventing him to take a deep breath. Pt used to take a unknown rx'ed medication for his chest discomfort every 8 hours daily that maked him sleepy and relaxed for relief of his sxs. Pt was rx'ed 20 tabs but he ran out so he no longer takes that medication. His spasms occur when he's at rest -  doesn't occur while he's  working. Pt had a back injury while playing with a ball when his muscle spasms started and suspects his chest pain stem from his back pain since they started at the same time. Denies PMHx of heart disease, or MI. Deneis N/V.  Depression: Reactive with cultural change and social stressors. Counseling numbers provided. Depression screen [facility] 2/9 09/17/2016 09/10/2016 09/10/2016 09/10/2016  Decreased Interest 0 1 1 0  Down, Depressed, Hopeless 0 1 - 1  PHQ - 2 Score 0 Altered sleeping - 1 - -  Tired, decreased energy - 1 - -  Change in appetite - 1 - -  Feeling bad or failure about yourself  - 1 - -  Trouble concentrating - 1 - -  Moving slowly or fidgety/restless - 1 - -  Suicidal thoughts - 0 - -  PHQ-9 Score - 8 - -  Difficult doing work/chores - Very difficult - -    There are no active problems to display for this patient.  No past medical history on file. No past surgical history on file. Not on File Prior to Admission medications   Medication Sig Start Date End Date Taking? Authorizing Provider  omeprazole (PRILOSEC) 20 MG capsule Take 1 capsule (20 mg total) by mouth daily. 09/10/16   Shade Flood, MD   Social History   Social History  . Marital status: Married    Spouse name: N/A  .  Number of children: N/A  . Years of education: N/A   Occupational History  . Not on file.   Social History Main Topics  . Smoking status: Never Smoker  . Smokeless tobacco: Never Used  . Alcohol use Not on file  . Drug use: Unknown  . Sexual activity: Not on file   Other Topics Concern  . Not on file   Social History Narrative  . No narrative on file   Review of Systems  Constitutional: Negative for diaphoresis and fever.  Gastrointestinal: Negative for nausea and vomiting.  Genitourinary: Positive for difficulty urinating and flank pain. Negative for dysuria and hematuria.  Musculoskeletal: Positive for back pain.   Objective:  Physical Exam  Constitutional: He appears  well-developed and well-nourished. No distress.  HENT:  Head: Normocephalic and atraumatic.  Eyes: Conjunctivae are normal.  Neck: Neck supple.  Cardiovascular: Normal rate.   Pulmonary/Chest: Effort normal. He exhibits no tenderness.  Abdominal: There is tenderness (minimal) in the left lower quadrant. There is no rebound and no guarding.  Musculoskeletal:  Left shoulder full range motion, slight discomfort with abduction. Full rotator cuff strength. Pain on lateral shoulder with abduction. Negative empty can testing.  Neurological: He is alert.  Skin: Skin is warm and dry.  Psychiatric: He has a normal mood and affect. His behavior is normal.  Nursing note and vitals reviewed.   Vitals:   09/17/16 1106  BP: 122/73  Pulse: 67  Resp: 16  Temp: 98.5 F (36.9 C)  TempSrc: Oral  SpO2: 98%  Weight: 190 lb 3.2 oz (86.3 kg)  Height: 5' 6.5" (1.689 m)   Body mass index is 30.24 kg/m.   Results for orders placed or performed in visit on 09/17/16  POCT urinalysis dipstick  Result Value Ref Range   Color, UA yellow yellow   Clarity, UA clear clear   Glucose, UA negative negative   Bilirubin, UA negative negative   Ketones, POC UA negative negative   Spec Grav, UA 1.025 1.030 - 1.035   Blood, UA negative negative   pH, UA 6.0 5.0 - 8.0   Protein Ur, POC negative negative   Urobilinogen, UA 0.2 Negative - 2.0   Nitrite, UA Negative Negative   Leukocytes, UA Negative Negative  POCT Microscopic Urinalysis ([facility])  Result Value Ref Range   WBC,UR,HPF,POC None None WBC/hpf   RBC,UR,HPF,POC None None RBC/hpf   Bacteria Few (A) None, Too numerous to count   Mucus Absent Absent   Epithelial Cells, UR Per Microscopy None None, Too numerous to count cells/hpf   Assessment & Plan:   Marc Tate is a 46 y.o. male Flank pain - Plan: CT RENAL STONE STUDY, Basic metabolic panel, HYDROcodone-acetaminophen (NORCO/VICODIN) 5-325 MG tablet Nephrolithiasis - Plan: CT RENAL STONE STUDY, Basic  metabolic panel, HYDROcodone-acetaminophen (NORCO/VICODIN) 5-325 MG tablet Urinary hesitancy - Plan: POCT urinalysis dipstick, POCT Microscopic Urinalysis ([facility]), PSA, CT RENAL STONE STUDY, Basic metabolic panel  - pain improving. Potential kidney stones were noted on previous abdominal x-ray. With persistent discomfort, check renal stone study in next 2 days. . Repeat BMP to rule out postobstructive nephropathy, but normal prior.   - hydrocodone if needed for worse pain as possible kidney stone. Side effects discussed.   Left sided chest pain - Plan: Ambulatory referral to Cardiology  - possible MSK source as some L shoulder discomfort with ROM, but did not reproduce chest pain. Reported breathing change with pain at times.   -refer to cardiology for eval, but  if more musculoskeletal picture, return to discuss further with possible XR.  Language barrier  - interpreter used by video, understanding expressed.    Meds ordered this encounter  Medications  . HYDROcodone-acetaminophen (NORCO/VICODIN) 5-325 MG tablet    Sig: Take 1 tablet by mouth every 6 (six) hours as needed for moderate pain.    Dispense:  15 tablet    Refill:  0   Patient Instructions    I will check ct scan to look at kidney stones.  Drink plenty of fluid/water. I will also check prostate and kidney tests.   If cat scan is ok, and pain does not improve in 1 week - return for recheck.   Return to the clinic or go to the nearest emergency room if any of your symptoms worsen or new symptoms occur.  I will refer you to heart doctor, but if they say your pain is due to muscle pain - return to discuss further.    IF you received an x-ray today, you will receive an invoice from [facility]. Please contact [facility] at [phone] with questions or concerns regarding your invoice.   IF you received labwork today, you will receive an invoice from [place]. Please contact [facility] at [phone] with  questions or concerns regarding your invoice.   Our billing staff will not be able to assist you with questions regarding bills from these companies.  You will be contacted with the lab results as soon as they are available. The fastest way to get your results is to activate your My Chart account. Instructions are located on the last page of this paperwork. If you have not heard from [place] regarding the results in 2 weeks, please contact this office.      I personally performed the services described in this documentation, which was scribed in my presence. The recorded information has been reviewed and considered for accuracy and completeness, addended by me as needed, and agree with information above.  Signed,   [name], MD Primary Care at [facility].  09/17/16 12:52 PM

## 2016-09-17 NOTE — Patient Instructions (Addendum)
  I will check ct scan to look at kidney stones.  Drink plenty of fluid/water. I will also check prostate and kidney tests.   If cat scan is ok, and pain does not improve in 1 week - return for recheck.   Return to the clinic or go to the nearest emergency room if any of your symptoms worsen or new symptoms occur.  I will refer you to heart doctor, but if they say your pain is due to muscle pain - return to discuss further.   430 ON 09/18/16 [place] [address] CT SCAN  IF you received an x-ray today, you will receive an invoice from [facility]. Please contact [facility] at [phone] with questions or concerns regarding your invoice.   IF you received labwork today, you will receive an invoice from [place]. Please contact [facility] at [phone] with questions or concerns regarding your invoice.   Our billing staff will not be able to assist you with questions regarding bills from these companies.  You will be contacted with the lab results as soon as they are available. The fastest way to get your results is to activate your My Chart account. Instructions are located on the last page of this paperwork. If you have not heard from [place] regarding the results in 2 weeks, please contact this office.

## 2016-09-18 ENCOUNTER — Ambulatory Visit (HOSPITAL_COMMUNITY)
Admission: RE | Admit: 2016-09-18 | Discharge: 2016-09-18 | Disposition: A | Discharge status: Home / Self Care | Payer: BLUE CROSS/BLUE SHIELD | Source: Ambulatory Visit | Attending: Family Medicine | Admitting: Family Medicine

## 2016-09-18 DIAGNOSIS — N202 Calculus of kidney with calculus of ureter: Secondary | ICD-10-CM | POA: Insufficient documentation

## 2016-09-18 DIAGNOSIS — R109 Unspecified abdominal pain: Secondary | ICD-10-CM

## 2016-09-18 DIAGNOSIS — N200 Calculus of kidney: Secondary | ICD-10-CM

## 2016-09-18 DIAGNOSIS — R3911 Hesitancy of micturition: Secondary | ICD-10-CM | POA: Diagnosis present

## 2016-09-18 LAB — BASIC METABOLIC PANEL
BUN/Creatinine Ratio: 21 — ABNORMAL HIGH (ref 9–20)
BUN: 17 mg/dL (ref 6–24)
CHLORIDE: 99 mmol/L (ref 96–106)
CO2: 27 mmol/L (ref 18–29)
Calcium: 9.4 mg/dL (ref 8.7–10.2)
Creatinine, Ser: 0.8 mg/dL (ref 0.76–1.27)
GFR calc non Af Amer: 109 mL/min/{1.73_m2} (ref >59)
GFR, EST AFRICAN AMERICAN: 126 mL/min/{1.73_m2} (ref >59)
Glucose: 100 mg/dL — ABNORMAL HIGH (ref 65–99)
POTASSIUM: 4.1 mmol/L (ref 3.5–5.2)
Sodium: 139 mmol/L (ref 134–144)

## 2016-09-18 LAB — PSA: PROSTATE SPECIFIC AG, SERUM: 0.5 ng/mL (ref 0.0–4.0)

## 2016-09-19 ENCOUNTER — Encounter: Payer: Self-pay | Admitting: *Deleted

## 2016-09-19 ENCOUNTER — Encounter (HOSPITAL_BASED_OUTPATIENT_CLINIC_OR_DEPARTMENT_OTHER): Payer: Self-pay | Admitting: *Deleted

## 2016-09-24 ENCOUNTER — Ambulatory Visit (INDEPENDENT_AMBULATORY_CARE_PROVIDER_SITE_OTHER): Payer: BLUE CROSS/BLUE SHIELD | Admitting: Family Medicine

## 2016-09-24 ENCOUNTER — Encounter: Payer: Self-pay | Admitting: Family Medicine

## 2016-09-24 VITALS — BP 133/88 | HR 76 | Temp 98.1°F | Resp 16 | Wt 190.2 lb

## 2016-09-24 DIAGNOSIS — N200 Calculus of kidney: Secondary | ICD-10-CM | POA: Diagnosis not present

## 2016-09-24 DIAGNOSIS — Z789 Other specified health status: Secondary | ICD-10-CM | POA: Diagnosis not present

## 2016-09-24 DIAGNOSIS — R300 Dysuria: Secondary | ICD-10-CM

## 2016-09-24 LAB — POCT URINALYSIS DIP (MANUAL ENTRY)
Bilirubin, UA: NEGATIVE
Blood, UA: NEGATIVE
Glucose, UA: NEGATIVE mg/dL
Ketones, POC UA: NEGATIVE mg/dL
Leukocytes, UA: NEGATIVE
Nitrite, UA: NEGATIVE
PROTEIN UA: NEGATIVE mg/dL
SPEC GRAV UA: 1.025 (ref 1.010–1.025)
Urobilinogen, UA: 0.2 E.U./dL
pH, UA: 5 (ref 5.0–8.0)

## 2016-09-24 LAB — POC MICROSCOPIC URINALYSIS (UMFC): Mucus: ABSENT

## 2016-09-24 MED ORDER — TAMSULOSIN HCL 0.4 MG PO CAPS: 0.4000 mg | ORAL_CAPSULE | Freq: Every day | ORAL | 0 refills | Status: DC

## 2016-09-24 NOTE — Patient Instructions (Addendum)
Flomax one per day. Ibuprofen or tylenol if needed or hydrocodone if needed for more severe pain. Tamsulosin once per day to help with passing of kidney stone. Use filter and if any kidney stone is passed, bring into next office visit. Recheck in 1 week. If stone has not yet passed, I will refer you to urology to discuss other options on removal.   Kidney Stones Kidney stones (urolithiasis) are solid, rock-like deposits that form inside of the organs that make urine (kidneys). A kidney stone may form in a kidney and move into the bladder, where it can cause intense pain and block the flow of urine. Kidney stones are created when high levels of certain minerals are found in the urine. They are usually passed through urination, but in some cases, medical treatment may be needed to remove them. What are the causes? Kidney stones may be caused by:  A condition in which certain glands produce too much parathyroid hormone (primary hyperparathyroidism), which causes too much calcium buildup in the blood.  Buildup of uric acid crystals in the bladder (hyperuricosuria). Uric acid is a chemical that the body produces when you eat certain foods. It usually exits the body in the urine.  Narrowing (stricture) of one or both of the tubes that drain urine from the kidneys to the bladder (ureters).  A kidney blockage that is present at birth (congenital obstruction).  Past surgery on the kidney or the ureters, such as gastric bypass surgery. What increases the risk? The following factors make you more likely to develop kidney stones:  Having had a kidney stone in the past.  Having a family history of kidney stones.  Not drinking enough water.  Eating a diet that is high in protein, salt (sodium), or sugar.  Being overweight or obese. What are the signs or symptoms? Symptoms of a kidney stone may include:  Nausea.  Vomiting.  Blood in the urine (hematuria).  Pain in the side of the abdomen,  right below the ribs (flank pain). Pain usually spreads (radiates) to the groin.  Needing to urinate frequently or urgently. How is this diagnosed? This condition may be diagnosed based on:  Your medical history.  A physical exam.  Blood tests.  Urine tests.  CT scan.  Abdominal X-ray.  A procedure to examine the inside of the bladder (cystoscopy). How is this treated? Treatment for kidney stones depends on the size, location, and makeup of the stones. Treatment may involve:  Analyzing your urine before and after you pass the stone through urination.  Being monitored at the hospital until you pass the stone through urination.  Increasing your fluid intake and decreasing the amount of calcium and protein in your diet.  A procedure to break up kidney stones in the bladder using:  A focused beam of light (laser therapy).  Shock waves (extracorporeal shock wave lithotripsy).  Surgery to remove kidney stones. This may be needed if you have severe pain or have stones that block your urinary tract. Follow these instructions at home: Eating and drinking    Drink enough fluid to keep your urine clear or pale yellow. This will help you to pass the kidney stone.  If directed, change your diet. This may include:  Limiting how much sodium you eat.  Eating more fruits and vegetables.  Limiting how much meat, poultry, fish, and eggs you eat.  Follow instructions from your health care provider about eating or drinking restrictions. General instructions   Collect urine samples as  told by your health care provider. You may need to collect a urine sample:  24 hours after you pass the stone.  8-12 weeks after passing the kidney stone, and every 6-12 months after that.  Strain your urine every time you urinate, for as long as directed. Use the strainer that your health care provider recommends.  Do not throw out the kidney stone after passing it. Keep the stone so it can be  tested by your health care provider. Testing the makeup of your kidney stone may help prevent you from getting kidney stones in the future.  Take over-the-counter and prescription medicines only as told by your health care provider.  Keep all follow-up visits as told by your health care provider. This is important. You may need follow-up X-rays or ultrasounds to make sure that your stone has passed. How is this prevented? To prevent another kidney stone:  Drink enough fluid to keep your urine clear or pale yellow. This is the best way to prevent kidney stones.  Eat a healthy diet and follow recommendations from your health care provider about foods to avoid. You may be instructed to eat a low-protein diet. Recommendations vary depending on the type of kidney stone that you have.  Maintain a healthy weight. Contact a health care provider if:  You have pain that gets worse or does not get better with medicine. Get help right away if:  You have a fever or chills.  You develop severe pain.  You develop new abdominal pain.  You faint.  You are unable to urinate. This information is not intended to replace advice given to you by your health care provider. Make sure you discuss any questions you have with your health care provider. Document Released: 05/28/2005 Document Revised: 12/16/2015 Document Reviewed: 11/11/2015 Elsevier Interactive Patient Education  2017 [employer].   IF you received an x-ray today, you will receive an invoice from [facility]. Please contact [facility] at [phone] with questions or concerns regarding your invoice.   IF you received labwork today, you will receive an invoice from [place]. Please contact [facility] at [phone] with questions or concerns regarding your invoice.   Our billing staff will not be able to assist you with questions regarding bills from these companies.  You will be contacted with the lab results as soon as  they are available. The fastest way to get your results is to activate your My Chart account. Instructions are located on the last page of this paperwork. If you have not heard from [place] regarding the results in 2 weeks, please contact this office.

## 2016-09-24 NOTE — Progress Notes (Signed)
By signing my name below, I, [name], attest that this documentation has been prepared under the direction and in the presence of [name], MD.  Electronically Signed: [name], Medical Scribe. 09/24/16. 10:46 AM.  Subjective:    Patient ID: Marc Tate, male    DOB: [birth year], 46 y.o.   MRN: [id number]  HPI Chief Complaint  Patient presents with  . Follow-up    ct scan results    HPI Comments: Marc Tate is a 46 y.o. male who presents to the Primary Care at [facility] for flank pain follow-up. See last office visit 09/17/16, multiple concerns were addressed that day as well as other sxs. Ps native language is Arabic so [employer] [phone].  Flank Pain: He was improving at last visit but still some left sided flank pain. He was taking ibuprofen. Episodic discomfort urinating. Urinalysis negative, nl PSA last visit, and nl creatinine. He had a CT Renal Stone study indicating partially obstructing 4 mm stone distal left ureter  Reports flank pain is intermittent and mild. Reports associated sxs of difficulty urinating, and dysuria for the "past couple of days". Pt has only used hydrocodone for relief when the pain is severe, not when it's mild. Pt states he has not passed anything in his urine yet.  There are no active problems to display for this patient.  No past medical history on file. Past Surgical History:  Procedure Laterality Date  . HEMORRHOID SURGERY     Not on File Prior to Admission medications   Medication Sig Start Date End Date Taking? Authorizing Provider  cyclobenzaprine (FLEXERIL) 10 MG tablet Take 1 tablet (10 mg total) by mouth 3 (three) times daily as needed for muscle spasms. 11/09/14   John Molpus, MD  HYDROcodone-acetaminophen (NORCO/VICODIN) 5-325 MG tablet Take 1 tablet by mouth every 6 (six) hours as needed for moderate pain. 09/17/16   Shade Flood, MD  naproxen sodium (ANAPROX DS) 550 MG tablet Take 1  tablet every 12 hours as needed for pain. Best taken with a meal.20 11/09/14   Paula Libra, MD  omeprazole (PRILOSEC) 20 MG capsule Take 1 capsule (20 mg total) by mouth daily. 09/10/16   Shade Flood, MD   Social History   Social History  . Marital status: Married    Spouse name: N/A  . Number of children: N/A  . Years of education: N/A   Occupational History  . Not on file.   Social History Main Topics  . Smoking status: Never Smoker  . Smokeless tobacco: Never Used  . Alcohol use No  . Drug use: Unknown  . Sexual activity: No   Other Topics Concern  . Not on file   Social History Narrative   ** Merged History Encounter **       Review of Systems  Genitourinary: Positive for difficulty urinating, dysuria and flank pain.   Objective:  Physical Exam  Constitutional: He appears well-developed and well-nourished. No distress.  HENT:  Head: Normocephalic and atraumatic.  Eyes: Conjunctivae are normal.  Neck: Neck supple.  Cardiovascular: Normal rate, regular rhythm and normal heart sounds.  Exam reveals no gallop and no friction rub.   No murmur heard. Pulmonary/Chest: Effort normal and breath sounds normal. No respiratory distress. He has no decreased breath sounds. He has no wheezes. He has no rhonchi. He has no rales.  Abdominal: There is tenderness (minimal) in the suprapubic area. There is no rebound, no guarding and no  CVA tenderness.  Neurological: He is alert.  Skin: Skin is warm and dry.  Psychiatric: He has a normal mood and affect. His behavior is normal.  Nursing note and vitals reviewed.   Vitals:   09/24/16 1034  BP: 133/88  Pulse: 76  Resp: 16  Temp: 98.1 F (36.7 C)  TempSrc: Oral  SpO2: 100%  Weight: 190 lb 3.2 oz (86.3 kg)   Body mass index is 30.24 kg/m.   Results for orders placed or performed in visit on 09/24/16  POCT urinalysis dipstick  Result Value Ref Range   Color, UA yellow yellow   Clarity, UA clear clear   Glucose, UA  negative negative mg/dL   Bilirubin, UA negative negative   Ketones, POC UA negative negative mg/dL   Spec Grav, UA 4.540 9.811 - 1.025   Blood, UA negative negative   pH, UA 5.0 5.0 - 8.0   Protein Ur, POC negative negative mg/dL   Urobilinogen, UA 0.2 0.2 or 1.0 E.U./dL   Nitrite, UA Negative Negative   Leukocytes, UA Negative Negative  POCT Microscopic Urinalysis ([facility])  Result Value Ref Range   WBC,UR,HPF,POC None None WBC/hpf   RBC,UR,HPF,POC None None RBC/hpf   Bacteria None None, Too numerous to count   Mucus Absent Absent   Epithelial Cells, UR Per Microscopy None None, Too numerous to count cells/hpf    Assessment & Plan:  Marc Tate is a 46 y.o. male Left nephrolithiasis - Plan: POCT urinalysis dipstick, POCT Microscopic Urinalysis ([facility]), Basic metabolic panel, tamsulosin (FLOMAX) 0.4 MG CAPS capsule Dysuria - Plan: POCT urinalysis dipstick, POCT Microscopic Urinalysis ([facility]), Basic metabolic panel  - 4 mm nephrolith as noted on previous CT. Intermittent discomfort.   -Repeat urinalysis without sign of infection.  -Start Flomax, has hydrocodone if needed for breakthrough pain.  - Filter, collection container provided if he does pass a stone. If not improving within 1 week, consider urology evaluation, sooner if worse.  - BMP obtained.   Language barrier  - Interpreter used. Understanding expressed.  Meds ordered this encounter  Medications  . tamsulosin (FLOMAX) 0.4 MG CAPS capsule    Sig: Take 1 capsule (0.4 mg total) by mouth daily.    Dispense:  15 capsule    Refill:  0   Patient Instructions   Flomax one per day. Ibuprofen or tylenol if needed or hydrocodone if needed for more severe pain. Tamsulosin once per day to help with passing of kidney stone. Use filter and if any kidney stone is passed, bring into next office visit. Recheck in 1 week. If stone has not yet passed, I will refer you to urology to discuss other options on removal.   Kidney  Stones Kidney stones (urolithiasis) are solid, rock-like deposits that form inside of the organs that make urine (kidneys). A kidney stone may form in a kidney and move into the bladder, where it can cause intense pain and block the flow of urine. Kidney stones are created when high levels of certain minerals are found in the urine. They are usually passed through urination, but in some cases, medical treatment may be needed to remove them. What are the causes? Kidney stones may be caused by:  A condition in which certain glands produce too much parathyroid hormone (primary hyperparathyroidism), which causes too much calcium buildup in the blood.  Buildup of uric acid crystals in the bladder (hyperuricosuria). Uric acid is a chemical that the body produces when you eat certain foods. It usually exits  the body in the urine.  Narrowing (stricture) of one or both of the tubes that drain urine from the kidneys to the bladder (ureters).  A kidney blockage that is present at birth (congenital obstruction).  Past surgery on the kidney or the ureters, such as gastric bypass surgery. What increases the risk? The following factors make you more likely to develop kidney stones:  Having had a kidney stone in the past.  Having a family history of kidney stones.  Not drinking enough water.  Eating a diet that is high in protein, salt (sodium), or sugar.  Being overweight or obese. What are the signs or symptoms? Symptoms of a kidney stone may include:  Nausea.  Vomiting.  Blood in the urine (hematuria).  Pain in the side of the abdomen, right below the ribs (flank pain). Pain usually spreads (radiates) to the groin.  Needing to urinate frequently or urgently. How is this diagnosed? This condition may be diagnosed based on:  Your medical history.  A physical exam.  Blood tests.  Urine tests.  CT scan.  Abdominal X-ray.  A procedure to examine the inside of the bladder  (cystoscopy). How is this treated? Treatment for kidney stones depends on the size, location, and makeup of the stones. Treatment may involve:  Analyzing your urine before and after you pass the stone through urination.  Being monitored at the hospital until you pass the stone through urination.  Increasing your fluid intake and decreasing the amount of calcium and protein in your diet.  A procedure to break up kidney stones in the bladder using:  A focused beam of light (laser therapy).  Shock waves (extracorporeal shock wave lithotripsy).  Surgery to remove kidney stones. This may be needed if you have severe pain or have stones that block your urinary tract. Follow these instructions at home: Eating and drinking    Drink enough fluid to keep your urine clear or pale yellow. This will help you to pass the kidney stone.  If directed, change your diet. This may include:  Limiting how much sodium you eat.  Eating more fruits and vegetables.  Limiting how much meat, poultry, fish, and eggs you eat.  Follow instructions from your health care provider about eating or drinking restrictions. General instructions   Collect urine samples as told by your health care provider. You may need to collect a urine sample:  24 hours after you pass the stone.  8-12 weeks after passing the kidney stone, and every 6-12 months after that.  Strain your urine every time you urinate, for as long as directed. Use the strainer that your health care provider recommends.  Do not throw out the kidney stone after passing it. Keep the stone so it can be tested by your health care provider. Testing the makeup of your kidney stone may help prevent you from getting kidney stones in the future.  Take over-the-counter and prescription medicines only as told by your health care provider.  Keep all follow-up visits as told by your health care provider. This is important. You may need follow-up X-rays or  ultrasounds to make sure that your stone has passed. How is this prevented? To prevent another kidney stone:  Drink enough fluid to keep your urine clear or pale yellow. This is the best way to prevent kidney stones.  Eat a healthy diet and follow recommendations from your health care provider about foods to avoid. You may be instructed to eat a low-protein diet. Recommendations vary  depending on the type of kidney stone that you have.  Maintain a healthy weight. Contact a health care provider if:  You have pain that gets worse or does not get better with medicine. Get help right away if:  You have a fever or chills.  You develop severe pain.  You develop new abdominal pain.  You faint.  You are unable to urinate. This information is not intended to replace advice given to you by your health care provider. Make sure you discuss any questions you have with your health care provider. Document Released: 05/28/2005 Document Revised: 12/16/2015 Document Reviewed: 11/11/2015 Elsevier Interactive Patient Education  2017 [employer].   IF you received an x-ray today, you will receive an invoice from [facility]. Please contact [facility] at [phone] with questions or concerns regarding your invoice.   IF you received labwork today, you will receive an invoice from [place] [facility]. Please contact [facility] at [phone] with questions or concerns regarding your invoice.   Our billing staff will not be able to assist you with questions regarding bills from these companies.  You will be contacted with the lab results as soon as they are available. The fastest way to get your results is to activate your My Chart account. Instructions are located on the last page of this paperwork. If you have not heard from [place] regarding the results in 2 weeks, please contact this office.      I personally performed the services described in this documentation, which was scribed in my  presence. The recorded information has been reviewed and considered for accuracy and completeness, addended by me as needed, and agree with information above.  Signed,   [name], MD Primary Care at [facility].  09/24/16 6:48 PM

## 2016-09-25 LAB — BASIC METABOLIC PANEL
BUN/Creatinine Ratio: 15 (ref 9–20)
BUN: 12 mg/dL (ref 6–24)
CO2: 27 mmol/L (ref 18–29)
Calcium: 9.5 mg/dL (ref 8.7–10.2)
Chloride: 101 mmol/L (ref 96–106)
Creatinine, Ser: 0.8 mg/dL (ref 0.76–1.27)
GFR calc Af Amer: 126 mL/min/{1.73_m2} (ref >59)
GFR, EST NON AFRICAN AMERICAN: 109 mL/min/{1.73_m2} (ref >59)
Glucose: 103 mg/dL — ABNORMAL HIGH (ref 65–99)
Potassium: 4.2 mmol/L (ref 3.5–5.2)
SODIUM: 142 mmol/L (ref 134–144)

## 2016-10-01 ENCOUNTER — Ambulatory Visit (INDEPENDENT_AMBULATORY_CARE_PROVIDER_SITE_OTHER): Payer: BLUE CROSS/BLUE SHIELD

## 2016-10-01 ENCOUNTER — Ambulatory Visit (INDEPENDENT_AMBULATORY_CARE_PROVIDER_SITE_OTHER): Payer: BLUE CROSS/BLUE SHIELD | Admitting: Family Medicine

## 2016-10-01 VITALS — BP 136/86 | HR 71 | Temp 97.8°F | Resp 16 | Ht 66.0 in | Wt 192.4 lb

## 2016-10-01 DIAGNOSIS — R109 Unspecified abdominal pain: Secondary | ICD-10-CM | POA: Diagnosis not present

## 2016-10-01 DIAGNOSIS — N200 Calculus of kidney: Secondary | ICD-10-CM | POA: Diagnosis not present

## 2016-10-01 LAB — POCT CBC
GRANULOCYTE PERCENT: 57.5 % (ref 37–80)
HCT, POC: 40 % — AB (ref 43.5–53.7)
HEMOGLOBIN: 13.9 g/dL — AB (ref 14.1–18.1)
Lymph, poc: 1.8 (ref 0.6–3.4)
MCH: 29.7 pg (ref 27–31.2)
MCHC: 34.7 g/dL (ref 31.8–35.4)
MCV: 85.5 fL (ref 80–97)
MID (CBC): 0.1 (ref 0–0.9)
MPV: 8.2 fL (ref 0–99.8)
PLATELET COUNT, POC: 226 10*3/uL (ref 142–424)
POC Granulocyte: 2.5 (ref 2–6.9)
POC LYMPH PERCENT: 40.9 %L (ref 10–50)
POC MID %: 1.6 %M (ref 0–12)
RBC: 4.68 M/uL — AB (ref 4.69–6.13)
RDW, POC: 12.7 %
WBC: 4.3 10*3/uL — AB (ref 4.6–10.2)

## 2016-10-01 LAB — POCT URINALYSIS DIP (MANUAL ENTRY)
Bilirubin, UA: NEGATIVE
Blood, UA: NEGATIVE
GLUCOSE UA: NEGATIVE mg/dL
Ketones, POC UA: NEGATIVE mg/dL
Leukocytes, UA: NEGATIVE
Nitrite, UA: NEGATIVE
PH UA: 5.5 (ref 5.0–8.0)
Protein Ur, POC: NEGATIVE mg/dL
Spec Grav, UA: 1.02 (ref 1.010–1.025)
Urobilinogen, UA: 0.2 E.U./dL

## 2016-10-01 LAB — POC MICROSCOPIC URINALYSIS (UMFC): Mucus: ABSENT

## 2016-10-01 NOTE — Progress Notes (Addendum)
By signing my name below, I, [name], attest that this documentation has been prepared under the direction and in the presence of [name], MD.  Electronically Signed: [name], Medical Scribe. 10/01/16. 10:58 AM.  Subjective:    Patient ID: Marc Tate, male    DOB: July 28, 1970, 46 y.o.   MRN: [id number]  HPI Chief Complaint  Patient presents with  . Follow-up    kidney stones    HPI Comments: Marc Tate is a 46 y.o. male who presents to the Primary Care at [facility] for follow-up of nephrolithiasis. He had a 4 mm nephrolith noted on CT renal study 09/19/16 with episodic left flank pain. Mild pain and intermittent at last visit on 09/24/16. Started on flomax, and continued hydrocodone PRN. BMP overall okay with nl renal function. [employer] interpreter #[id number] Marc Tate was used.  Reports initial improvement after his last visit, but started experiencing worsening radiating pain from his left back to left abdomin onset 2 days ago. Pt has not been able to consistently use a filter when he urinates. Pt has been taking hydrocodone QD, and flomax for his sxs. Denies radiating pain to his genitals, nausea, emesis, fever, and hematuria.  There are no active problems to display for this patient.  History reviewed. No pertinent past medical history. Past Surgical History:  Procedure Laterality Date  . HEMORRHOID SURGERY     Not on File Prior to Admission medications   Medication Sig Start Date End Date Taking? Authorizing Provider  omeprazole (PRILOSEC) 20 MG capsule Take 1 capsule (20 mg total) by mouth daily. 09/10/16  Yes Shade Flood, MD  tamsulosin (FLOMAX) 0.4 MG CAPS capsule Take 1 capsule (0.4 mg total) by mouth daily. 09/24/16  Yes Shade Flood, MD  cyclobenzaprine (FLEXERIL) 10 MG tablet Take 1 tablet (10 mg total) by mouth 3 (three) times daily as needed for muscle spasms. Patient not taking: Reported on 10/01/2016 11/09/14   Paula Libra, MD    HYDROcodone-acetaminophen (NORCO/VICODIN) 5-325 MG tablet Take 1 tablet by mouth every 6 (six) hours as needed for moderate pain. Patient not taking: Reported on 10/01/2016 09/17/16   Shade Flood, MD  naproxen sodium (ANAPROX DS) 550 MG tablet Take 1 tablet every 12 hours as needed for pain. Best taken with a meal.20 Patient not taking: Reported on 10/01/2016 11/09/14   Paula Libra, MD   Social History   Social History  . Marital status: Married    Spouse name: N/A  . Number of children: N/A  . Years of education: N/A   Occupational History  . Not on file.   Social History Main Topics  . Smoking status: Never Smoker  . Smokeless tobacco: Never Used  . Alcohol use No  . Drug use: Unknown  . Sexual activity: No   Other Topics Concern  . Not on file   Social History Narrative   ** Merged History Encounter **       Review of Systems  Constitutional: Negative for fever.  Gastrointestinal: Negative for nausea and vomiting.  Genitourinary: Positive for flank pain. Negative for hematuria, penile pain and testicular pain.  Musculoskeletal: Positive for back pain.   Objective:  Physical Exam  Constitutional: He appears well-developed and well-nourished. No distress.  HENT:  Head: Normocephalic and atraumatic.  Eyes: Conjunctivae are normal.  Neck: Neck supple.  Cardiovascular: Normal rate, regular rhythm and normal heart sounds.  Exam reveals no gallop and no friction rub.   No murmur  heard. Pulmonary/Chest: Effort normal. No respiratory distress. He has no wheezes. He has no rales.  Abdominal: Bowel sounds are normal. There is tenderness (Minimal) in the left lower quadrant. There is no rebound and no guarding.  Slight discomfort on the left CVA Radiating pain from his left back to left abdomin - not to genitals.  Neurological: He is alert.  Skin: Skin is warm and dry.  Psychiatric: He has a normal mood and affect. His behavior is normal.  Nursing note and vitals  reviewed.   Vitals:   10/01/16 1021  BP: 136/86  Pulse: 71  Resp: 16  Temp: 97.8 F (36.6 C)  TempSrc: Oral  SpO2: 100%  Weight: 192 lb 6.4 oz (87.3 kg)  Height:  (1.676 m)  Body mass index is 31.05 kg/m.   Results for orders placed or performed in visit on 10/01/16  POCT CBC  Result Value Ref Range   WBC 4.3 (A) 4.6 - 10.2 K/uL   Lymph, poc 1.8 0.6 - 3.4   POC LYMPH PERCENT 40.9 10 - 50 %L   MID (cbc) 0.1 0 - 0.9   POC MID % 1.6 0 - 12 %M   POC Granulocyte 2.5 2 - 6.9   Granulocyte percent 57.5 37 - 80 %G   RBC 4.68 (A) 4.69 - 6.13 M/uL   Hemoglobin 13.9 (A) 14.1 - 18.1 g/dL   HCT, POC 16.1 (A) 09.6 - 53.7 %   MCV 85.5 80 - 97 fL   MCH, POC 29.7 27 - 31.2 pg   MCHC 34.7 31.8 - 35.4 g/dL   RDW, POC 04.5 %   Platelet Count, POC 226 142 - 424 K/uL   MPV 8.2 0 - 99.8 fL  POCT urinalysis dipstick  Result Value Ref Range   Color, UA yellow yellow   Clarity, UA clear clear   Glucose, UA negative negative mg/dL   Bilirubin, UA negative negative   Ketones, POC UA negative negative mg/dL   Spec Grav, UA 4.098 1.191 - 1.025   Blood, UA negative negative   pH, UA 5.5 5.0 - 8.0   Protein Ur, POC negative negative mg/dL   Urobilinogen, UA 0.2 0.2 or 1.0 E.U./dL   Nitrite, UA Negative Negative   Leukocytes, UA Negative Negative  POCT Microscopic Urinalysis ([facility])  Result Value Ref Range   WBC,UR,HPF,POC None None WBC/hpf   RBC,UR,HPF,POC None None RBC/hpf   Bacteria Few (A) None, Too numerous to count   Mucus Absent Absent   Epithelial Cells, UR Per Microscopy None None, Too numerous to count cells/hpf    Dg Abd 1 View  Result Date: 10/01/2016 CLINICAL DATA:  4 mm left-sided nephrolith on CT, persistent pain EXAM: ABDOMEN - 1 VIEW COMPARISON:  [facility] Long CT abdomen/ pelvis dated 09/18/2016 FINDINGS: Suspected 4-5 mm calculus overlying the left bladder, suspicious for distal left ureteral calculus at the UVJ. Nonobstructive bowel gas pattern. Visualized osseous  structures are within normal limits. IMPRESSION: Suspected 4-5 mm calculus overlying the left bladder, suspicious for distal left ureteral calculus at the UVJ. Electronically Signed   By: Charline Bills M.D.   On: 10/01/2016 11:57   Assessment & Plan:   Lumir Demetriou is a 46 y.o. male Nephrolith - Plan: POCT CBC, POCT urinalysis dipstick, POCT Microscopic Urinalysis ([facility]), Basic metabolic panel, DG Abd 1 View, Ambulatory referral to Urology  Left flank pain - Plan: POCT CBC, POCT urinalysis dipstick, POCT Microscopic Urinalysis ([facility]), Basic metabolic panel, DG Abd 1 View, Ambulatory  referral to Urology  Persistent nephrolith with left flank pain. Has not passed with use of Flomax and fluids. Continue Flomax, hydrocodone if needed for pain, refer to nephrology to discuss options. Check CBC, BMP for renal function. RTC precautions if worsening prior to urology eval.  No orders of the defined types were placed in this encounter.  Patient Instructions    Kidney stone is still present. I will refer you to nephrology. Continue Flomax once per day, drink plenty of fluids and use filter to try to check for kidney stone. Hydrocodone if needed. If you are running out of hydrocodone, let me know by tomorrow morning so I can refill that for you.  Return to the clinic or go to the nearest emergency room if any of your symptoms worsen or new symptoms occur.   Kidney Stones Kidney stones (urolithiasis) are solid, rock-like deposits that form inside of the organs that make urine (kidneys). A kidney stone may form in a kidney and move into the bladder, where it can cause intense pain and block the flow of urine. Kidney stones are created when high levels of certain minerals are found in the urine. They are usually passed through urination, but in some cases, medical treatment may be needed to remove them. What are the causes? Kidney stones may be caused by:  A condition in which certain glands produce too  much parathyroid hormone (primary hyperparathyroidism), which causes too much calcium buildup in the blood.  Buildup of uric acid crystals in the bladder (hyperuricosuria). Uric acid is a chemical that the body produces when you eat certain foods. It usually exits the body in the urine.  Narrowing (stricture) of one or both of the tubes that drain urine from the kidneys to the bladder (ureters).  A kidney blockage that is present at birth (congenital obstruction).  Past surgery on the kidney or the ureters, such as gastric bypass surgery. What increases the risk? The following factors make you more likely to develop kidney stones:  Having had a kidney stone in the past.  Having a family history of kidney stones.  Not drinking enough water.  Eating a diet that is high in protein, salt (sodium), or sugar.  Being overweight or obese. What are the signs or symptoms? Symptoms of a kidney stone may include:  Nausea.  Vomiting.  Blood in the urine (hematuria).  Pain in the side of the abdomen, right below the ribs (flank pain). Pain usually spreads (radiates) to the groin.  Needing to urinate frequently or urgently. How is this diagnosed? This condition may be diagnosed based on:  Your medical history.  A physical exam.  Blood tests.  Urine tests.  CT scan.  Abdominal X-ray.  A procedure to examine the inside of the bladder (cystoscopy). How is this treated? Treatment for kidney stones depends on the size, location, and makeup of the stones. Treatment may involve:  Analyzing your urine before and after you pass the stone through urination.  Being monitored at the hospital until you pass the stone through urination.  Increasing your fluid intake and decreasing the amount of calcium and protein in your diet.  A procedure to break up kidney stones in the bladder using:  A focused beam of light (laser therapy).  Shock waves (extracorporeal shock wave  lithotripsy).  Surgery to remove kidney stones. This may be needed if you have severe pain or have stones that block your urinary tract. Follow these instructions at home: Eating and drinking  Drink enough fluid to keep your urine clear or pale yellow. This will help you to pass the kidney stone.  If directed, change your diet. This may include:  Limiting how much sodium you eat.  Eating more fruits and vegetables.  Limiting how much meat, poultry, fish, and eggs you eat.  Follow instructions from your health care provider about eating or drinking restrictions. General instructions   Collect urine samples as told by your health care provider. You may need to collect a urine sample:  24 hours after you pass the stone.  8-12 weeks after passing the kidney stone, and every 6-12 months after that.  Strain your urine every time you urinate, for as long as directed. Use the strainer that your health care provider recommends.  Do not throw out the kidney stone after passing it. Keep the stone so it can be tested by your health care provider. Testing the makeup of your kidney stone may help prevent you from getting kidney stones in the future.  Take over-the-counter and prescription medicines only as told by your health care provider.  Keep all follow-up visits as told by your health care provider. This is important. You may need follow-up X-rays or ultrasounds to make sure that your stone has passed. How is this prevented? To prevent another kidney stone:  Drink enough fluid to keep your urine clear or pale yellow. This is the best way to prevent kidney stones.  Eat a healthy diet and follow recommendations from your health care provider about foods to avoid. You may be instructed to eat a low-protein diet. Recommendations vary depending on the type of kidney stone that you have.  Maintain a healthy weight. Contact a health care provider if:  You have pain that gets worse or  does not get better with medicine. Get help right away if:  You have a fever or chills.  You develop severe pain.  You develop new abdominal pain.  You faint.  You are unable to urinate. This information is not intended to replace advice given to you by your health care provider. Make sure you discuss any questions you have with your health care provider. Document Released: 05/28/2005 Document Revised: 12/16/2015 Document Reviewed: 11/11/2015 Elsevier Interactive Patient Education  2017 [employer].    IF you received an x-ray today, you will receive an invoice from [facility]. Please contact [facility] at [phone] with questions or concerns regarding your invoice.   IF you received labwork today, you will receive an invoice from [facility]. Please contact [facility] at [phone] with questions or concerns regarding your invoice.   Our billing staff will not be able to assist you with questions regarding bills from these companies.  You will be contacted with the lab results as soon as they are available. The fastest way to get your results is to activate your My Chart account. Instructions are located on the last page of this paperwork. If you have not heard from [place] regarding the results in 2 weeks, please contact this office.      I personally performed the services described in this documentation, which was scribed in my presence. The recorded information has been reviewed and considered for accuracy and completeness, addended by me as needed, and agree with information above.  Signed,   [name], MD Primary Care at [facility].  10/01/16 12:31 PM

## 2016-10-01 NOTE — Patient Instructions (Addendum)
Kidney stone is still present. I will refer you to nephrology. Continue Flomax once per day, drink plenty of fluids and use filter to try to check for kidney stone. Hydrocodone if needed. If you are running out of hydrocodone, let me know by tomorrow morning so I can refill that for you.  Return to the clinic or go to the nearest emergency room if any of your symptoms worsen or new symptoms occur.   Kidney Stones Kidney stones (urolithiasis) are solid, rock-like deposits that form inside of the organs that make urine (kidneys). A kidney stone may form in a kidney and move into the bladder, where it can cause intense pain and block the flow of urine. Kidney stones are created when high levels of certain minerals are found in the urine. They are usually passed through urination, but in some cases, medical treatment may be needed to remove them. What are the causes? Kidney stones may be caused by:  A condition in which certain glands produce too much parathyroid hormone (primary hyperparathyroidism), which causes too much calcium buildup in the blood.  Buildup of uric acid crystals in the bladder (hyperuricosuria). Uric acid is a chemical that the body produces when you eat certain foods. It usually exits the body in the urine.  Narrowing (stricture) of one or both of the tubes that drain urine from the kidneys to the bladder (ureters).  A kidney blockage that is present at birth (congenital obstruction).  Past surgery on the kidney or the ureters, such as gastric bypass surgery. What increases the risk? The following factors make you more likely to develop kidney stones:  Having had a kidney stone in the past.  Having a family history of kidney stones.  Not drinking enough water.  Eating a diet that is high in protein, salt (sodium), or sugar.  Being overweight or obese. What are the signs or symptoms? Symptoms of a kidney stone may include:  Nausea.  Vomiting.  Blood in the  urine (hematuria).  Pain in the side of the abdomen, right below the ribs (flank pain). Pain usually spreads (radiates) to the groin.  Needing to urinate frequently or urgently. How is this diagnosed? This condition may be diagnosed based on:  Your medical history.  A physical exam.  Blood tests.  Urine tests.  CT scan.  Abdominal X-ray.  A procedure to examine the inside of the bladder (cystoscopy). How is this treated? Treatment for kidney stones depends on the size, location, and makeup of the stones. Treatment may involve:  Analyzing your urine before and after you pass the stone through urination.  Being monitored at the hospital until you pass the stone through urination.  Increasing your fluid intake and decreasing the amount of calcium and protein in your diet.  A procedure to break up kidney stones in the bladder using:  A focused beam of light (laser therapy).  Shock waves (extracorporeal shock wave lithotripsy).  Surgery to remove kidney stones. This may be needed if you have severe pain or have stones that block your urinary tract. Follow these instructions at home: Eating and drinking    Drink enough fluid to keep your urine clear or pale yellow. This will help you to pass the kidney stone.  If directed, change your diet. This may include:  Limiting how much sodium you eat.  Eating more fruits and vegetables.  Limiting how much meat, poultry, fish, and eggs you eat.  Follow instructions from your health care provider about eating  or drinking restrictions. General instructions   Collect urine samples as told by your health care provider. You may need to collect a urine sample:  24 hours after you pass the stone.  8-12 weeks after passing the kidney stone, and every 6-12 months after that.  Strain your urine every time you urinate, for as long as directed. Use the strainer that your health care provider recommends.  Do not throw out the kidney  stone after passing it. Keep the stone so it can be tested by your health care provider. Testing the makeup of your kidney stone may help prevent you from getting kidney stones in the future.  Take over-the-counter and prescription medicines only as told by your health care provider.  Keep all follow-up visits as told by your health care provider. This is important. You may need follow-up X-rays or ultrasounds to make sure that your stone has passed. How is this prevented? To prevent another kidney stone:  Drink enough fluid to keep your urine clear or pale yellow. This is the best way to prevent kidney stones.  Eat a healthy diet and follow recommendations from your health care provider about foods to avoid. You may be instructed to eat a low-protein diet. Recommendations vary depending on the type of kidney stone that you have.  Maintain a healthy weight. Contact a health care provider if:  You have pain that gets worse or does not get better with medicine. Get help right away if:  You have a fever or chills.  You develop severe pain.  You develop new abdominal pain.  You faint.  You are unable to urinate. This information is not intended to replace advice given to you by your health care provider. Make sure you discuss any questions you have with your health care provider. Document Released: 05/28/2005 Document Revised: 12/16/2015 Document Reviewed: 11/11/2015 Elsevier Interactive Patient Education  2017 [employer].    IF you received an x-ray today, you will receive an invoice from [facility]. Please contact [facility] at [phone] with questions or concerns regarding your invoice.   IF you received labwork today, you will receive an invoice from [facility]. Please contact [facility] at [phone] with questions or concerns regarding your invoice.   Our billing staff will not be able to assist you with questions regarding bills from these  companies.  You will be contacted with the lab results as soon as they are available. The fastest way to get your results is to activate your My Chart account. Instructions are located on the last page of this paperwork. If you have not heard from [place] regarding the results in 2 weeks, please contact this office.

## 2016-10-02 LAB — BASIC METABOLIC PANEL
BUN / CREAT RATIO: 15 (ref 9–20)
BUN: 12 mg/dL (ref 6–24)
CO2: 28 mmol/L (ref 18–29)
CREATININE: 0.78 mg/dL (ref 0.76–1.27)
Calcium: 9.7 mg/dL (ref 8.7–10.2)
Chloride: 101 mmol/L (ref 96–106)
GFR calc Af Amer: 127 mL/min/{1.73_m2} (ref >59)
GFR calc non Af Amer: 110 mL/min/{1.73_m2} (ref >59)
Glucose: 109 mg/dL — ABNORMAL HIGH (ref 65–99)
Potassium: 4.2 mmol/L (ref 3.5–5.2)
Sodium: 141 mmol/L (ref 134–144)

## 2016-10-03 ENCOUNTER — Encounter: Payer: Self-pay | Admitting: Radiology

## 2016-10-04 ENCOUNTER — Encounter: Payer: Self-pay | Admitting: Radiology

## 2016-10-08 ENCOUNTER — Ambulatory Visit (INDEPENDENT_AMBULATORY_CARE_PROVIDER_SITE_OTHER): Payer: BLUE CROSS/BLUE SHIELD | Admitting: Family Medicine

## 2016-10-08 ENCOUNTER — Encounter: Payer: Self-pay | Admitting: Family Medicine

## 2016-10-08 VITALS — BP 131/85 | HR 69 | Temp 98.5°F | Resp 17 | Ht 66.0 in | Wt 189.0 lb

## 2016-10-08 DIAGNOSIS — R51 Headache: Secondary | ICD-10-CM | POA: Diagnosis not present

## 2016-10-08 DIAGNOSIS — N200 Calculus of kidney: Secondary | ICD-10-CM | POA: Diagnosis not present

## 2016-10-08 DIAGNOSIS — R1010 Upper abdominal pain, unspecified: Secondary | ICD-10-CM | POA: Diagnosis not present

## 2016-10-08 DIAGNOSIS — R519 Headache, unspecified: Secondary | ICD-10-CM

## 2016-10-08 DIAGNOSIS — R12 Heartburn: Secondary | ICD-10-CM | POA: Diagnosis not present

## 2016-10-08 MED ORDER — OMEPRAZOLE 20 MG PO CPDR: 20.0000 mg | DELAYED_RELEASE_CAPSULE | Freq: Every day | ORAL | 1 refills | Status: DC

## 2016-10-08 MED ORDER — TAMSULOSIN HCL 0.4 MG PO CAPS: 0.4000 mg | ORAL_CAPSULE | Freq: Every day | ORAL | 1 refills | Status: DC

## 2016-10-08 NOTE — Progress Notes (Signed)
Patient ID: Grantland Want, male    DOB: 03/12/1971  Age: 46 y.o. MRN: [id number]  Chief Complaint  Patient presents with  . Medication Refill    prilosec and flomax    Subjective:   46 year old man from [place] who has been in the [place] for a few years. He has had a kidney stone over the past month which has not yet passed. He has seen the urologist and is making an appointment to return there in a month. He took the Flomax and has not passed the stone yet. He only has mild to moderate pain. He is not drinking a lot of water according to his wife. He awakened this morning with early with a bad headache. He felt like it might be his blood pressure, but his blood pressure is normal at this time.  Current allergies, medications, problem list, past/family and social histories reviewed.  Objective:  BP 131/85 (BP Location: Right Arm, Patient Position: Sitting, Cuff Size: Normal)   Pulse 69   Temp 98.5 F (36.9 C) (Oral)   Resp 17   Ht  (1.676 m)   Wt 189 lb (85.7 kg)   SpO2 98%   BMI 30.51 kg/m   Fundi benign. Eyes PERRLA. EOMs intact. No current bruits. Chest clear. Heart regular without murmur. Left CVA tenderness.  Assessment & Plan:   Assessment: 1. Nonintractable headache, unspecified chronicity pattern, unspecified headache type   2. Left nephrolithiasis   3. Upper abdominal pain   4. Heartburn       Plan: Per instructions  No orders of the defined types were placed in this encounter.   Meds ordered this encounter  Medications  . tamsulosin (FLOMAX) 0.4 MG CAPS capsule    Sig: Take 1 capsule (0.4 mg total) by mouth daily.    Dispense:  30 capsule    Refill:  1  . omeprazole (PRILOSEC) 20 MG capsule    Sig: Take 1 capsule (20 mg total) by mouth daily.    Dispense:  30 capsule    Refill:  1         Patient Instructions   Drink lots of water to keep yourself urinating very much  Take the tamsulosin (Flomax) 1 each day to try and open up the  ureter so that the stone can pass easier  Make certain that you keep your appointment with the urologist specialist  Continue to take the omeprazole (Prilosec) daily for the stomach. If your stomach does well for a long time you can decrease use and start taking it every other day and see how you do.  I do not know why he had the bad headache today. Your blood pressure is okay. If you have headaches go ahead and take acetaminophen (Tylenol) 500 mg 2 pills, which you can do 2 or 3 times every day.  If the headaches are getting worse please return  Return as necessary.    IF you received an x-ray today, you will receive an invoice from [facility]. Please contact [facility] Radiology at [phone] with questions or concerns regarding your invoice.   IF you received labwork today, you will receive an invoice from [place]. Please contact [facility] at [phone] with questions or concerns regarding your invoice.   Our billing staff will not be able to assist you with questions regarding bills from these companies.  You will be contacted with the lab results as soon as they are available. The fastest way to get your results is  to activate your My Chart account. Instructions are located on the last page of this paperwork. If you have not heard from [place] regarding the results in 2 weeks, please contact this office.        No Follow-up on file.   Romey Cohea, MD 10/08/2016

## 2016-10-08 NOTE — Patient Instructions (Addendum)
Drink lots of water to keep yourself urinating very much  Take the tamsulosin (Flomax) 1 each day to try and open up the ureter so that the stone can pass easier  Make certain that you keep your appointment with the urologist specialist  Continue to take the omeprazole (Prilosec) daily for the stomach. If your stomach does well for a long time you can decrease use and start taking it every other day and see how you do.  I do not know why he had the bad headache today. Your blood pressure is okay. If you have headaches go ahead and take acetaminophen (Tylenol) 500 mg 2 pills, which you can do 2 or 3 times every day.  If the headaches are getting worse please return  Return as necessary.    IF you received an x-ray today, you will receive an invoice from [facility]. Please contact [facility] at [phone] with questions or concerns regarding your invoice.   IF you received labwork today, you will receive an invoice from [place]. Please contact [facility] at [phone] with questions or concerns regarding your invoice.   Our billing staff will not be able to assist you with questions regarding bills from these companies.  You will be contacted with the lab results as soon as they are available. The fastest way to get your results is to activate your My Chart account. Instructions are located on the last page of this paperwork. If you have not heard from [place] regarding the results in 2 weeks, please contact this office.

## 2016-10-11 ENCOUNTER — Telehealth: Payer: Self-pay | Admitting: Family Medicine

## 2016-10-11 NOTE — Telephone Encounter (Signed)
Pt's cousin called to let us know that if we need to contact the pt we need to call after 5 because he works 8-5. I was not sure if someone called the pt but if so, he can be contacted after 5pm. Thanks!

## 2016-10-27 ENCOUNTER — Ambulatory Visit (INDEPENDENT_AMBULATORY_CARE_PROVIDER_SITE_OTHER): Payer: BLUE CROSS/BLUE SHIELD | Admitting: Family Medicine

## 2016-10-27 ENCOUNTER — Encounter: Payer: Self-pay | Admitting: Family Medicine

## 2016-10-27 VITALS — BP 104/69 | HR 67 | Temp 98.1°F | Resp 16 | Ht 66.0 in | Wt 185.8 lb

## 2016-10-27 DIAGNOSIS — R197 Diarrhea, unspecified: Secondary | ICD-10-CM

## 2016-10-27 DIAGNOSIS — R112 Nausea with vomiting, unspecified: Secondary | ICD-10-CM

## 2016-10-27 DIAGNOSIS — N200 Calculus of kidney: Secondary | ICD-10-CM | POA: Diagnosis not present

## 2016-10-27 MED ORDER — ONDANSETRON 4 MG PO TBDP: 4.0000 mg | ORAL_TABLET | Freq: Three times a day (TID) | ORAL | 0 refills | Status: DC | PRN

## 2016-10-27 NOTE — Patient Instructions (Addendum)
zofran if needed for nausea, over the counter Immodium if needed. If not continuing to improve tonight and tomorrow, or any worsening symptoms sooner such as fever, or worse abdominal pain - return here or emergency room for other testing.   I will check into urology referral Monday for kidney stone. Return to the clinic or go to the nearest emergency room if any of your symptoms worsen or new symptoms occur.   Vomiting, Adult Vomiting occurs when stomach contents are thrown up and out of the mouth. Many people notice nausea before vomiting. Vomiting can make you feel weak and dehydrated. Dehydration can make you tired and thirsty, cause you to have a dry mouth, and decrease how often you urinate. Older adults and people who have other diseases or a weak immune system are at higher risk for dehydration.It is important to treat vomiting as told by your health care provider. Follow these instructions at home: Follow your health care provider's instructions about how to care for yourself at home. Eating and drinking  Follow these recommendations as told by your health care provider:  Take an oral rehydration solution (ORS). This is a drink that is sold at pharmacies and retail stores.  Eat bland, easy-to-digest foods in small amounts as you are able. These foods include bananas, applesauce, rice, lean meats, toast, and crackers.  Drink clear fluids in small amounts as you are able. Clear fluids include water, ice chips, low-calorie sports drinks, and fruit juice that has water added (diluted fruit juice).  Avoid fluids that contain a lot of sugar or caffeine.  Avoid alcohol and foods that are spicy or fatty. General instructions    Wash your hands frequently with soap and water. If soap and water are not available, use hand sanitizer. Make sure that everyone in your household washes their hands frequently.  Take over-the-counter and prescription medicines only as told by your health care  provider.  Watch your condition for any changes.  Keep all follow-up visits as told by your health care provider. This is important. Contact a health care provider if:  You have a fever.  You are not able to keep fluids down.  Your vomiting gets worse.  You have new symptoms.  You feel light-headed or dizzy.  You have a headache.  You have muscle cramps. Get help right away if:  You have pain in your chest, neck, arm, or jaw.  You feel extremely weak or you faint.  You have persistent vomiting.  You have vomit that is bright red or looks like black coffee grounds.  You have stools that are bloody or black, or stools that look like tar.  You have severe pain, cramping, or bloating in your abdomen.  You have a severe headache, a stiff neck, or both.  You have a rash.  You have trouble breathing or you are breathing very quickly.  Your heart is beating very quickly.  Your skin feels cold and clammy.  You feel confused.  You have pain while urinating.  You have signs of dehydration, such as:  Dark urine, or very little or no urine.  Cracked lips.  Dry mouth.  Sunken eyes.  Sleepiness.  Weakness. These symptoms may represent a serious problem that is an emergency. Do not wait to see if the symptoms will go away. Get medical help right away. Call your local emergency services (911 in the [place] Do not drive yourself to the hospital. This information is not intended to replace advice given  to you by your health care provider. Make sure you discuss any questions you have with your health care provider. Document Released: 06/24/2015 Document Revised: 11/03/2015 Document Reviewed: 02/01/2015 Elsevier Interactive Patient Education  2017 Elsevier Inc.   Diarrhea, Adult Diarrhea is frequent loose and watery bowel movements. Diarrhea can make you feel weak and cause you to become dehydrated. Dehydration can make you tired and thirsty, cause you to have a dry  mouth, and decrease how often you urinate. Diarrhea typically lasts 2-3 days. However, it can last longer if it is a sign of something more serious. It is important to treat your diarrhea as told by your health care provider. Follow these instructions at home: Eating and drinking   Follow these recommendations as told by your health care provider:  Take an oral rehydration solution (ORS). This is a drink that is sold at pharmacies and retail stores.  Drink clear fluids, such as water, ice chips, diluted fruit juice, and low-calorie sports drinks.  Eat bland, easy-to-digest foods in small amounts as you are able. These foods include bananas, applesauce, rice, lean meats, toast, and crackers.  Avoid drinking fluids that contain a lot of sugar or caffeine, such as energy drinks, sports drinks, and soda.  Avoid alcohol.  Avoid spicy or fatty foods. General instructions   Drink enough fluid to keep your urine clear or pale yellow.  Wash your hands often. If soap and water are not available, use hand sanitizer.  Make sure that all people in your household wash their hands well and often.  Take over-the-counter and prescription medicines only as told by your health care provider.  Rest at home while you recover.  Watch your condition for any changes.  Take a warm bath to relieve any burning or pain from frequent diarrhea episodes.  Keep all follow-up visits as told by your health care provider. This is important. Contact a health care provider if:  You have a fever.  Your diarrhea gets worse.  You have new symptoms.  You cannot keep fluids down.  You feel light-headed or dizzy.  You have a headache  You have muscle cramps. Get help right away if:  You have chest pain.  You feel extremely weak or you faint.  You have bloody or black stools or stools that look like tar.  You have severe pain, cramping, or bloating in your abdomen.  You have trouble breathing or you  are breathing very quickly.  Your heart is beating very quickly.  Your skin feels cold and clammy.  You feel confused.  You have signs of dehydration, such as:  Dark urine, very little urine, or no urine.  Cracked lips.  Dry mouth.  Sunken eyes.  Sleepiness.  Weakness. This information is not intended to replace advice given to you by your health care provider. Make sure you discuss any questions you have with your health care provider. Document Released: 05/18/2002 Document Revised: 10/06/2015 Document Reviewed: 02/01/2015 Elsevier Interactive Patient Education  2017 [employer].   IF you received an x-ray today, you will receive an invoice from [facility]. Please contact [facility] at [phone] with questions or concerns regarding your invoice.   IF you received labwork today, you will receive an invoice from [place]. Please contact [facility] at [phone] with questions or concerns regarding your invoice.   Our billing staff will not be able to assist you with questions regarding bills from these companies.  You will be contacted with the lab results as  soon as they are available. The fastest way to get your results is to activate your My Chart account. Instructions are located on the last page of this paperwork. If you have not heard from [place] regarding the results in 2 weeks, please contact this office.

## 2016-10-27 NOTE — Progress Notes (Signed)
Subjective:  By signing my name below, I, [name], attest that this documentation has been prepared under the direction and in the presence of [name], MD. Electronically Signed: [name], Scribe. 10/27/2016 , 3:32 PM .  Patient was seen in Room 2 .   Patient ID: Marc Tate, male    DOB: May 09, 1971, 46 y.o.   MRN: [id number] Chief Complaint  Patient presents with  . Emesis    started last night  . Diarrhea    started last night   HPI Marc Tate is a 46 y.o. male Here for nausea, vomiting and diarrhea. Patient states his vomiting and diarrhea started at 3:00AM this morning. His wife had diarrhea 2 days ago, but improved. He reports having leftover chicken that's been sitting out for a while left on the stove last night. He felt chills but denies fever. He denies any recent travel outside the [place]. He's had diarrhea 2 days ago, and another episode of diarrhea yesterday. He took a tablet for diarrhea yesterday. Today, he informs 4-5 episodes of diarrhea with 5-6 episodes of vomiting. Earlier today, he wasn't able to keep fluids down, but as the day went on, his symptoms improved and was able to keep his fluids down. He denies blood in stool or in vomit.   He also mentions not receiving call back from the urologist. He received a call while he was driving and wasn't able to answer. When he returned the call, they informed him that they would reschedule his appointment, but hasn't received any updates. He notes the flank pain is still present without any change. Occasionally, his pain would increase when changing positions from sitting to standing.   Patient's primary language is Arabic. A [employer] video interpreter was called: [name].   There are no active problems to display for this patient.  History reviewed. No pertinent past medical history. Past Surgical History:  Procedure Laterality Date  . HEMORRHOID SURGERY     No Known Allergies Prior to Admission  medications   Medication Sig Start Date End Date Taking? Authorizing Provider  omeprazole (PRILOSEC) 20 MG capsule Take 1 capsule (20 mg total) by mouth daily. 10/08/16  Yes Peyton Najjar, MD  tamsulosin (FLOMAX) 0.4 MG CAPS capsule Take 1 capsule (0.4 mg total) by mouth daily. 10/08/16  Yes Peyton Najjar, MD   Social History   Social History  . Marital status: Married    Spouse name: N/A  . Number of children: N/A  . Years of education: N/A   Occupational History  . Not on file.   Social History Main Topics  . Smoking status: Never Smoker  . Smokeless tobacco: Never Used  . Alcohol use No  . Drug use: Unknown  . Sexual activity: No   Other Topics Concern  . Not on file   Social History Narrative   ** Merged History Encounter **       Review of Systems  Constitutional: Negative for fatigue and unexpected weight change.  Eyes: Negative for visual disturbance.  Respiratory: Negative for cough, chest tightness and shortness of breath.   Cardiovascular: Negative for chest pain, palpitations and leg swelling.  Gastrointestinal: Positive for abdominal pain, diarrhea, nausea and vomiting. Negative for blood in stool.  Genitourinary: Positive for flank pain.  Neurological: Negative for dizziness, light-headedness and headaches.       Objective:   Physical Exam  Constitutional: He is oriented to person, place, and time. He appears well-developed and well-nourished. No  distress.  HENT:  Head: Normocephalic and atraumatic.  Eyes: EOM are normal. Pupils are equal, round, and reactive to light.  Neck: Neck supple.  Cardiovascular: Normal rate, regular rhythm and normal heart sounds.  Exam reveals no gallop and no friction rub.   No murmur heard. Pulmonary/Chest: Effort normal and breath sounds normal. No respiratory distress. He has no wheezes.  Abdominal: Bowel sounds are increased. There is tenderness (minimal) in the left lower quadrant.  Hyperactive bowel sounds, no high  pitch bowel sounds; minimal LLQ tenderness, overall non tender  Musculoskeletal: Normal range of motion.  Neurological: He is alert and oriented to person, place, and time.  Skin: Skin is warm and dry.  Psychiatric: He has a normal mood and affect. His behavior is normal.  Nursing note and vitals reviewed.   Vitals:   10/27/16 1434  BP: 104/69  Pulse: 67  Resp: 16  Temp: 98.1 F (36.7 C)  TempSrc: Oral  SpO2: 99%  Weight: 185 lb 12.8 oz (84.3 kg)  Height: 5\' 6"  (1.676 m)      Assessment & Plan:    [name] is a 46 y.o. male Nausea and vomiting, intractability of vomiting not specified, unspecified vomiting type - Plan: ondansetron (ZOFRAN ODT) 4 MG disintegrating tablet Diarrhea, unspecified type  - Improving. Reassuring vitals and exam. Suspected viral gastroenteritis versus foodborne illness. Appears well-hydrated, and tolerating by mouth fluids this time.  - Zofran 4 mg every 8 hours when necessary, Imodium if needed. RTC precautions if worsening as may need stool studies at that time  Nephrolithiasis  - Has not yet seen neurology. Will check into status on Monday and try to get him in next week.  Meds ordered this encounter  Medications  . ondansetron (ZOFRAN ODT) 4 MG disintegrating tablet    Sig: Take 1 tablet (4 mg total) by mouth every 8 (eight) hours as needed for nausea or vomiting.    Dispense:  10 tablet    Refill:  0   Patient Instructions   zofran if needed for nausea, over the counter Immodium if needed. If not continuing to improve tonight and tomorrow, or any worsening symptoms sooner such as fever, or worse abdominal pain - return here or emergency room for other testing.   I will check into urology referral Monday for kidney stone. Return to the clinic or go to the nearest emergency room if any of your symptoms worsen or new symptoms occur.   Vomiting, Adult Vomiting occurs when stomach contents are thrown up and out of the mouth. Many people  notice nausea before vomiting. Vomiting can make you feel weak and dehydrated. Dehydration can make you tired and thirsty, cause you to have a dry mouth, and decrease how often you urinate. Older adults and people who have other diseases or a weak immune system are at higher risk for dehydration.It is important to treat vomiting as told by your health care provider. Follow these instructions at home: Follow your health care provider's instructions about how to care for yourself at home. Eating and drinking  Follow these recommendations as told by your health care provider:  Take an oral rehydration solution (ORS). This is a drink that is sold at pharmacies and retail stores.  Eat bland, easy-to-digest foods in small amounts as you are able. These foods include bananas, applesauce, rice, lean meats, toast, and crackers.  Drink clear fluids in small amounts as you are able. Clear fluids include water, ice chips, low-calorie sports drinks, and  fruit juice that has water added (diluted fruit juice).  Avoid fluids that contain a lot of sugar or caffeine.  Avoid alcohol and foods that are spicy or fatty. General instructions    Wash your hands frequently with soap and water. If soap and water are not available, use hand sanitizer. Make sure that everyone in your household washes their hands frequently.  Take over-the-counter and prescription medicines only as told by your health care provider.  Watch your condition for any changes.  Keep all follow-up visits as told by your health care provider. This is important. Contact a health care provider if:  You have a fever.  You are not able to keep fluids down.  Your vomiting gets worse.  You have new symptoms.  You feel light-headed or dizzy.  You have a headache.  You have muscle cramps. Get help right away if:  You have pain in your chest, neck, arm, or jaw.  You feel extremely weak or you faint.  You have persistent  vomiting.  You have vomit that is bright red or looks like black coffee grounds.  You have stools that are bloody or black, or stools that look like tar.  You have severe pain, cramping, or bloating in your abdomen.  You have a severe headache, a stiff neck, or both.  You have a rash.  You have trouble breathing or you are breathing very quickly.  Your heart is beating very quickly.  Your skin feels cold and clammy.  You feel confused.  You have pain while urinating.  You have signs of dehydration, such as:  Dark urine, or very little or no urine.  Cracked lips.  Dry mouth.  Sunken eyes.  Sleepiness.  Weakness. These symptoms may represent a serious problem that is an emergency. Do not wait to see if the symptoms will go away. Get medical help right away. Call your local emergency services (911 in the [place] Do not drive yourself to the hospital. This information is not intended to replace advice given to you by your health care provider. Make sure you discuss any questions you have with your health care provider. Document Released: 06/24/2015 Document Revised: 11/03/2015 Document Reviewed: 02/01/2015 Elsevier Interactive Patient Education  2017 Elsevier Inc.   Diarrhea, Adult Diarrhea is frequent loose and watery bowel movements. Diarrhea can make you feel weak and cause you to become dehydrated. Dehydration can make you tired and thirsty, cause you to have a dry mouth, and decrease how often you urinate. Diarrhea typically lasts 2-3 days. However, it can last longer if it is a sign of something more serious. It is important to treat your diarrhea as told by your health care provider. Follow these instructions at home: Eating and drinking   Follow these recommendations as told by your health care provider:  Take an oral rehydration solution (ORS). This is a drink that is sold at pharmacies and retail stores.  Drink clear fluids, such as water, ice chips, diluted  fruit juice, and low-calorie sports drinks.  Eat bland, easy-to-digest foods in small amounts as you are able. These foods include bananas, applesauce, rice, lean meats, toast, and crackers.  Avoid drinking fluids that contain a lot of sugar or caffeine, such as energy drinks, sports drinks, and soda.  Avoid alcohol.  Avoid spicy or fatty foods. General instructions   Drink enough fluid to keep your urine clear or pale yellow.  Wash your hands often. If soap and water are not available, use hand  sanitizer.  Make sure that all people in your household wash their hands well and often.  Take over-the-counter and prescription medicines only as told by your health care provider.  Rest at home while you recover.  Watch your condition for any changes.  Take a warm bath to relieve any burning or pain from frequent diarrhea episodes.  Keep all follow-up visits as told by your health care provider. This is important. Contact a health care provider if:  You have a fever.  Your diarrhea gets worse.  You have new symptoms.  You cannot keep fluids down.  You feel light-headed or dizzy.  You have a headache  You have muscle cramps. Get help right away if:  You have chest pain.  You feel extremely weak or you faint.  You have bloody or black stools or stools that look like tar.  You have severe pain, cramping, or bloating in your abdomen.  You have trouble breathing or you are breathing very quickly.  Your heart is beating very quickly.  Your skin feels cold and clammy.  You feel confused.  You have signs of dehydration, such as:  Dark urine, very little urine, or no urine.  Cracked lips.  Dry mouth.  Sunken eyes.  Sleepiness.  Weakness. This information is not intended to replace advice given to you by your health care provider. Make sure you discuss any questions you have with your health care provider. Document Released: 05/18/2002 Document Revised:  10/06/2015 Document Reviewed: 02/01/2015 Elsevier Interactive Patient Education  2017 [employer]   IF you received an x-ray today, you will receive an invoice from [facility]. Please contact [facility] at [phone] with questions or concerns regarding your invoice.   IF you received labwork today, you will receive an invoice from [facility]. Please contact [facility] at [phone] with questions or concerns regarding your invoice.   Our billing staff will not be able to assist you with questions regarding bills from these companies.  You will be contacted with the lab results as soon as they are available. The fastest way to get your results is to activate your My Chart account. Instructions are located on the last page of this paperwork. If you have not heard from us regarding the results in 2 weeks, please contact this office.       I personally performed the services described in this documentation, which was scribed in my presence. The recorded information has been reviewed and considered for accuracy and completeness, addended by me as needed, and agree with information above.  Signed,   Meredith StaggersJeffrey Inara Dike, MD [facility].  10/27/16 5:32 PM

## 2016-10-29 ENCOUNTER — Telehealth: Payer: Self-pay | Admitting: Family Medicine

## 2016-10-29 NOTE — Telephone Encounter (Signed)
Called [facility] to see if they could get pt in this week per Dr. Thomasene LotGreen's request as pt's symptoms are getting worse. Pt was referred to [facility] but they were unable to get in touch with pt before. I provided an alternative phone number and the nurse said she would look over the notes and do her best to get him worked in this week. Nurse will call referrals back once she reviews these.

## 2016-10-30 NOTE — Telephone Encounter (Signed)
[facility] called and can see pt today at 2:15. I tried calling both pt's numbers and still was unable to get pt. One number did not work and other vm was full.

## 2016-10-30 NOTE — Telephone Encounter (Signed)
fyi

## 2016-10-31 NOTE — Telephone Encounter (Signed)
Please try again. I believe he said he is unable to answer the phone at times due to work, so may just need to try at different times. Thanks.

## 2016-10-31 NOTE — Telephone Encounter (Signed)
Tried calling pt again. Pt's one number rang busy and the other went straight to vm and is full. Is there an alternative number [phone] or [phone] that we have in epic?

## 2016-10-31 NOTE — Telephone Encounter (Signed)
This is a referral appointment not a clerical appointment sending to them

## 2016-11-28 ENCOUNTER — Other Ambulatory Visit: Payer: Self-pay | Admitting: Urology

## 2016-11-29 ENCOUNTER — Encounter (HOSPITAL_BASED_OUTPATIENT_CLINIC_OR_DEPARTMENT_OTHER): Payer: Self-pay | Admitting: *Deleted

## 2016-11-30 ENCOUNTER — Encounter (HOSPITAL_BASED_OUTPATIENT_CLINIC_OR_DEPARTMENT_OTHER): Payer: Self-pay | Admitting: *Deleted

## 2016-11-30 NOTE — Progress Notes (Addendum)
SPOKE W/ PT'S SON, AHMAD Polich, WHOM INTERPRETED FOR PT.  NPO AFTER MN W/ EXCEPTION CLEAR LIQUIDS UNTIL 0830 (NO CREAM/ MILK PRODUCTS).  ARRIVE AT 1300.  NEEDS HG AND KUB.  WILL TAKE PRILOSEC AM DOS W/ SIPS OF WATER.  SON REQUESTED INTERPRETER DOS , HE'S NOT COMFORTABLE WITHOUT ONE.  REQUESTED [employer] INTERPRETER TO ARRIVE AT 1245.  WILL PLACE CONFIRMATION IN CHART WHEN RECEIVED.

## 2016-12-06 ENCOUNTER — Ambulatory Visit (HOSPITAL_BASED_OUTPATIENT_CLINIC_OR_DEPARTMENT_OTHER)
Admission: RE | Admit: 2016-12-06 | Discharge: 2016-12-06 | Disposition: A | Discharge status: Home / Self Care | Payer: BLUE CROSS/BLUE SHIELD | Source: Ambulatory Visit | Attending: Urology | Admitting: Urology

## 2016-12-06 ENCOUNTER — Ambulatory Visit (HOSPITAL_BASED_OUTPATIENT_CLINIC_OR_DEPARTMENT_OTHER): Payer: BLUE CROSS/BLUE SHIELD | Admitting: Anesthesiology

## 2016-12-06 ENCOUNTER — Encounter (HOSPITAL_BASED_OUTPATIENT_CLINIC_OR_DEPARTMENT_OTHER): Payer: Self-pay

## 2016-12-06 ENCOUNTER — Ambulatory Visit (HOSPITAL_COMMUNITY): Payer: BLUE CROSS/BLUE SHIELD

## 2016-12-06 ENCOUNTER — Encounter (HOSPITAL_BASED_OUTPATIENT_CLINIC_OR_DEPARTMENT_OTHER)
Admission: RE | Disposition: A | Discharge status: Home / Self Care | Payer: Self-pay | Source: Ambulatory Visit | Attending: Urology

## 2016-12-06 DIAGNOSIS — N201 Calculus of ureter: Secondary | ICD-10-CM

## 2016-12-06 DIAGNOSIS — Z87442 Personal history of urinary calculi: Secondary | ICD-10-CM | POA: Insufficient documentation

## 2016-12-06 DIAGNOSIS — N202 Calculus of kidney with calculus of ureter: Secondary | ICD-10-CM | POA: Insufficient documentation

## 2016-12-06 DIAGNOSIS — K219 Gastro-esophageal reflux disease without esophagitis: Secondary | ICD-10-CM | POA: Insufficient documentation

## 2016-12-06 HISTORY — DX: Calculus of ureter: N20.1

## 2016-12-06 HISTORY — DX: Gastro-esophageal reflux disease without esophagitis: K21.9

## 2016-12-06 HISTORY — PX: CYSTOSCOPY WITH RETROGRADE PYELOGRAM, URETEROSCOPY AND STENT PLACEMENT: SHX5789

## 2016-12-06 LAB — POCT HEMOGLOBIN-HEMACUE: Hemoglobin: 13.2 g/dL (ref 13.0–17.0)

## 2016-12-06 SURGERY — CYSTOURETEROSCOPY, WITH RETROGRADE PYELOGRAM AND STENT INSERTION
Anesthesia: General | Laterality: Left

## 2016-12-06 MED ORDER — LIDOCAINE 2% (20 MG/ML) 5 ML SYRINGE
INTRAMUSCULAR | Status: DC | PRN
  Administered 2016-12-06: 60 mg via INTRAVENOUS

## 2016-12-06 MED ORDER — FENTANYL CITRATE (PF) 100 MCG/2ML IJ SOLN
INTRAMUSCULAR | Status: AC
  Filled 2016-12-06: qty 2

## 2016-12-06 MED ORDER — LIDOCAINE 2% (20 MG/ML) 5 ML SYRINGE
INTRAMUSCULAR | Status: AC
  Filled 2016-12-06: qty 5

## 2016-12-06 MED ORDER — HYDROMORPHONE HCL 1 MG/ML IJ SOLN
0.2500 mg | INTRAMUSCULAR | Status: DC | PRN
  Filled 2016-12-06: qty 0.5

## 2016-12-06 MED ORDER — PROPOFOL 10 MG/ML IV BOLUS
INTRAVENOUS | Status: AC
  Filled 2016-12-06: qty 20

## 2016-12-06 MED ORDER — MIDAZOLAM HCL 5 MG/5ML IJ SOLN
INTRAMUSCULAR | Status: DC | PRN
  Administered 2016-12-06: 2 mg via INTRAVENOUS

## 2016-12-06 MED ORDER — SULFAMETHOXAZOLE-TRIMETHOPRIM 800-160 MG PO TABS: 1.0000 | ORAL_TABLET | Freq: Two times a day (BID) | ORAL | 0 refills | Status: DC

## 2016-12-06 MED ORDER — MIDAZOLAM HCL 2 MG/2ML IJ SOLN
INTRAMUSCULAR | Status: AC
  Filled 2016-12-06: qty 2

## 2016-12-06 MED ORDER — OXYCODONE HCL 5 MG PO TABS
ORAL_TABLET | ORAL | Status: AC
  Filled 2016-12-06: qty 1

## 2016-12-06 MED ORDER — ONDANSETRON HCL 4 MG/2ML IJ SOLN
INTRAMUSCULAR | Status: AC
  Filled 2016-12-06: qty 2

## 2016-12-06 MED ORDER — DEXAMETHASONE SODIUM PHOSPHATE 10 MG/ML IJ SOLN
INTRAMUSCULAR | Status: AC
  Filled 2016-12-06: qty 1

## 2016-12-06 MED ORDER — OXYCODONE HCL 5 MG PO TABS
5.0000 mg | ORAL_TABLET | Freq: Once | ORAL | Status: AC | PRN
  Administered 2016-12-06: 5 mg via ORAL
  Filled 2016-12-06: qty 1

## 2016-12-06 MED ORDER — ONDANSETRON HCL 4 MG/2ML IJ SOLN
INTRAMUSCULAR | Status: DC | PRN
  Administered 2016-12-06: 4 mg via INTRAVENOUS

## 2016-12-06 MED ORDER — CEFAZOLIN SODIUM-DEXTROSE 2-4 GM/100ML-% IV SOLN
INTRAVENOUS | Status: AC
  Filled 2016-12-06: qty 100

## 2016-12-06 MED ORDER — FENTANYL CITRATE (PF) 100 MCG/2ML IJ SOLN
INTRAMUSCULAR | Status: DC | PRN
  Administered 2016-12-06: 50 ug via INTRAVENOUS

## 2016-12-06 MED ORDER — PROPOFOL 10 MG/ML IV BOLUS
INTRAVENOUS | Status: DC | PRN
  Administered 2016-12-06: 180 mg via INTRAVENOUS

## 2016-12-06 MED ORDER — CEFAZOLIN SODIUM-DEXTROSE 2-4 GM/100ML-% IV SOLN
2.0000 g | INTRAVENOUS | Status: AC
  Administered 2016-12-06: 2 g via INTRAVENOUS
  Filled 2016-12-06: qty 100

## 2016-12-06 MED ORDER — PROMETHAZINE HCL 25 MG/ML IJ SOLN
6.2500 mg | INTRAMUSCULAR | Status: DC | PRN
  Filled 2016-12-06: qty 1

## 2016-12-06 MED ORDER — LACTATED RINGERS IV SOLN
INTRAVENOUS | Status: DC
  Administered 2016-12-06 (×2): via INTRAVENOUS
  Filled 2016-12-06: qty 1000

## 2016-12-06 MED ORDER — OXYCODONE HCL 5 MG/5ML PO SOLN
5.0000 mg | Freq: Once | ORAL | Status: AC | PRN
  Filled 2016-12-06: qty 5

## 2016-12-06 MED ORDER — DEXAMETHASONE SODIUM PHOSPHATE 4 MG/ML IJ SOLN
INTRAMUSCULAR | Status: DC | PRN
Start: 2016-12-06 — End: 2016-12-06
  Administered 2016-12-06: 10 mg via INTRAVENOUS

## 2016-12-06 MED ORDER — KETOROLAC TROMETHAMINE 30 MG/ML IJ SOLN
INTRAMUSCULAR | Status: DC | PRN
  Administered 2016-12-06: 30 mg via INTRAVENOUS

## 2016-12-06 SURGICAL SUPPLY — 34 items
BAG DRAIN URO-CYSTO SKYTR STRL (DRAIN) ×2 IMPLANT
BASKET LASER NITINOL 1.9FR (BASKET) IMPLANT
BASKET STNLS GEMINI 4WIRE 3FR (BASKET) IMPLANT
BASKET ZERO TIP NITINOL 2.4FR (BASKET) IMPLANT
CATH INTERMIT  6FR 70CM (CATHETERS) ×2 IMPLANT
CATH URET 5FR 28IN CONE TIP (BALLOONS)
CATH URET 5FR 28IN OPEN ENDED (CATHETERS) IMPLANT
CATH URET 5FR 70CM CONE TIP (BALLOONS) IMPLANT
CLOTH BEACON ORANGE TIMEOUT ST (SAFETY) ×2 IMPLANT
ELECT REM PT RETURN 9FT ADLT (ELECTROSURGICAL)
ELECTRODE REM PT RTRN 9FT ADLT (ELECTROSURGICAL) IMPLANT
EXTRACTOR STONE NITINOL NGAGE (UROLOGICAL SUPPLIES) ×2 IMPLANT
FIBER LASER FLEXIVA 365 (UROLOGICAL SUPPLIES) IMPLANT
FIBER LASER TRAC TIP (UROLOGICAL SUPPLIES) IMPLANT
GLOVE BIO SURGEON STRL SZ8 (GLOVE) ×2 IMPLANT
GOWN STRL REUS W/ TWL LRG LVL3 (GOWN DISPOSABLE) ×1 IMPLANT
GOWN STRL REUS W/ TWL XL LVL3 (GOWN DISPOSABLE) ×1 IMPLANT
GOWN STRL REUS W/TWL LRG LVL3 (GOWN DISPOSABLE) ×1
GOWN STRL REUS W/TWL XL LVL3 (GOWN DISPOSABLE) ×1
GUIDEWIRE 0.038 PTFE COATED (WIRE) IMPLANT
GUIDEWIRE ANG ZIPWIRE 038X150 (WIRE) IMPLANT
GUIDEWIRE STR DUAL SENSOR (WIRE) ×2 IMPLANT
IV NS IRRIG 3000ML ARTHROMATIC (IV SOLUTION) ×4 IMPLANT
KIT BALLIN UROMAX 15FX10 (LABEL) IMPLANT
KIT BALLN UROMAX 15FX4 (MISCELLANEOUS) IMPLANT
KIT BALLN UROMAX 26 75X4 (MISCELLANEOUS)
KIT RM TURNOVER CYSTO AR (KITS) ×2 IMPLANT
LASER FIBER DISP (UROLOGICAL SUPPLIES) IMPLANT
MANIFOLD NEPTUNE II (INSTRUMENTS) IMPLANT
PACK CYSTO (CUSTOM PROCEDURE TRAY) ×2 IMPLANT
SET HIGH PRES BAL DIL (LABEL)
SHEATH ACCESS URETERAL 24CM (SHEATH) ×2 IMPLANT
SHEATH ACCESS URETERAL 38CM (SHEATH) IMPLANT
TUBE CONNECTING 12X1/4 (SUCTIONS) IMPLANT

## 2016-12-06 NOTE — Anesthesia Preprocedure Evaluation (Addendum)
Anesthesia Evaluation  Patient identified by MRN, date of birth, ID band Patient awake    Reviewed: Allergy & Precautions, NPO status , Patient's Chart, lab work & pertinent test results  Airway Mallampati: III  TM Distance: >3 FB Neck ROM: Full    Dental no notable dental hx.    Pulmonary neg pulmonary ROS,    Pulmonary exam normal breath sounds clear to auscultation       Cardiovascular negative cardio ROS Normal cardiovascular exam Rhythm:Regular Rate:Normal  ECG: SR, rate 64   Neuro/Psych negative neurological ROS  negative psych ROS   GI/Hepatic Neg liver ROS, Patient takes medicine for intermittent stomach pain. Currently asymptomatic   Endo/Other  negative endocrine ROS  Renal/GU negative Renal ROS  negative genitourinary   Musculoskeletal negative musculoskeletal ROS (+)   Abdominal   Peds negative pediatric ROS (+)  Hematology negative hematology ROS (+)   Anesthesia Other Findings   Reproductive/Obstetrics negative OB ROS                            Anesthesia Physical Anesthesia Plan  ASA: I  Anesthesia Plan: General   Post-op Pain Management:    Induction: Intravenous  PONV Risk Score and Plan:   Airway Management Planned: LMA  Additional Equipment:   Intra-op Plan:   Post-operative Plan:   Informed Consent: I have reviewed the patients History and Physical, chart, labs and discussed the procedure including the risks, benefits and alternatives for the proposed anesthesia with the patient or authorized representative who has indicated his/her understanding and acceptance.   Dental advisory given  Plan Discussed with: CRNA  Anesthesia Plan Comments:        Anesthesia Quick Evaluation

## 2016-12-06 NOTE — Op Note (Signed)
Preop Dx: Left distal ureteral stone  Postop Dx: Same  Procedure: Cystoscopy, lt retrograde pyelogram, left ureteroscopy, left ureteral stone extraction  Surgeon: Kyannah Climer   Anesthesia: General  Complications: None  Specimen: Ureteral stone, to the patient's family.  Estimated blood loss: None  Drains: None  Indications, 46 year old African-American male who recently presented with a left ureteral stone.  He was symptomatic for approximately 2 months.  Follow-up at that time revealed a 4 mm left distal ureteral stone.  Because of passage and intermittentsymptoms, the patient presents at this time for ureteroscopic management.  I discussed the procedure with him, as well as risks and complications in the office and also for this procedure through an interpreter.  He understands procedure, the risks involved and desires to proceed.  Description of procedure:The patient was properly identified in the holding area and the surgical site was marked.  He was preoperative IV antibiotics.  He was taken to the operating room where general anesthetic was administered with the LMA.  He is placed in the dorsolithotomy position.  Genitalia and perineum were prepped and draped.  Proper timeout was performed.  A 21 French panendoscope was advanced under direct vision through his urethra which was normal.  Prostate was nonobstructive.  Bladder was entered and inspected circumferentially.  Normal ureters, normal configuration, normal urothelium.  The left ureteral orifice was then cannulated with a 6 [place] open-ended catheter.  Using Omnipaque, gentle retrograde ureteropyelogram was performed.  Findings included a normal caliber ureter with a distal ureteral filling defect consistent with the stone.  The remaining ureter was normal, pyelocalyceal system was of normal caliber without filling defects.    At this point point, a 0.038 inchSensor tip guidewire was advanced through the open-ended catheter and  advanced up into the upper pole calyceal system. Following this, both the cystoscope and the open-ended catheter removed, leaving the guidewire in place.  I then dilated the ureteral orifice first with the inner core and then with the entire 12/14 ureteral access catheter.   This point, the access catheter was removed and the guidewire was left in place.  I advanced a 6 French dual-lumen semirigid ureteroscope first of the urethra and then up into the bladder and ureter.  The stone was encountered approximately 5-6 cm proximal in the ureter.  It was grasped with the engage basket, and easily extracted.  There was no significant ureteral trauma, so I did not feel like we eventually would need a double-J stent.  The ureteroscope was advanced all the way up to the renal pelvis and no further stones were seen.  Following this, the ureteroscope and the guidewire were removed.  The bladder was drained, and the procedure terminated.  The patient was then awakened and taken to the PACU in stable condition.  He tolerated the procedure well.

## 2016-12-06 NOTE — Interval H&P Note (Signed)
History and Physical Interval Note:  12/06/2016 2:43 PM  Marc Tate  has presented today for surgery, with the diagnosis of LEFT URETERAL STONE  The various methods of treatment have been discussed with the patient and family. After consideration of risks, benefits and other options for treatment, the patient has consented to  Procedure(s): CYSTOSCOPY WITH RETROGRADE PYELOGRAM, URETEROSCOPY, STONE EXTRACTION AND POSSIBLE STENT PLACEMENT (Left) HOLMIUM LASER APPLICATION (Left) as a surgical intervention .  The patient's history has been reviewed, patient examined, no change in status, stable for surgery.  I have reviewed the patient's chart and labs.  Questions were answered to the patient's satisfaction.     [name], Sibyl Mikula M

## 2016-12-06 NOTE — Discharge Instructions (Signed)
1. You may see some blood in the urine and may have some burning with urination for 48-72 hours. You also may notice that you have to urinate more frequently or urgently after your procedure which is normal.  2. You should call should you develop an inability urinate, fever > 101, persistent nausea and vomiting that prevents you from eating or drinking to stay hydrated.  You may periodically feel a strong urge to void. This is a bladder spasm and most often can occur when having a bowel movement or moving around. It is typically self-limited and usually will stop after a few minutes. You may also see some blood in the urine.  A very small amount of blood can make the urine look quite red. Post Anesthesia Home Care Instructions  Activity: Get plenty of rest for the remainder of the day. A responsible individual must stay with you for 24 hours following the procedure.  For the next 24 hours, DO NOT: -Drive a car -Advertising copywriterperate machinery -Drink alcoholic beverages -Take any medication unless instructed by your physician -Make any legal decisions or sign important papers.  Meals: Start with liquid foods such as gelatin or soup. Progress to regular foods as tolerated. Avoid greasy, spicy, heavy foods. If nausea and/or vomiting occur, drink only clear liquids until the nausea and/or vomiting subsides. Call your physician if vomiting continues.  Special Instructions/Symptoms: Your throat may feel dry or sore from the anesthesia or the breathing tube placed in your throat during surgery. If this causes discomfort, gargle with warm salt water. The discomfort should disappear within 24 hours.  If you had a scopolamine patch placed behind your ear for the management of post- operative nausea and/or vomiting:  1. The medication in the patch is effective for 72 hours, after which it should be removed.  Wrap patch in a tissue and discard in the trash. Wash hands thoroughly with soap and water. 2. You may remove  the patch earlier than 72 hours if you experience unpleasant side effects which may include dry mouth, dizziness or visual disturbances. 3. Avoid touching the patch. Wash your hands with soap and water after contact with the patch.   3.

## 2016-12-06 NOTE — Progress Notes (Signed)
Pt up w/ standby asst to BR.  Voided 50cc bright red urine.  Increased IV rate.  Will monitor pt and asst up to BR prn

## 2016-12-06 NOTE — Transfer of Care (Signed)
Immediate Anesthesia Transfer of Care Note  Patient: Marc Tate  Procedure(s) Performed: Procedure(s) (LRB): CYSTOSCOPY WITH RETROGRADE PYELOGRAM, URETEROSCOPY, STONE EXTRACTION (Left)  Patient Location: PACU  Anesthesia Type: MAC  Level of Consciousness: awake, alert , oriented and patient cooperative  Airway & Oxygen Therapy: Patient Spontanous Breathing and Patient connected to face mask oxygen  Post-op Assessment: Report given to PACU RN and Post -op Vital signs reviewed and stable  Post vital signs: Reviewed and stable  Complications: No apparent anesthesia complications  Last Vitals:  Vitals:   12/06/16 1317  BP: 134/84  Pulse: (!) 57  Resp: 16  Temp: 36.9 C    Last Pain:  Vitals:   12/06/16 1423  TempSrc:   PainSc: 3       Patients Stated Pain Goal: 3 (12/06/16 1423)

## 2016-12-06 NOTE — H&P (Signed)
H&P  Chief Complaint: Kidney stone  History of Present Illness: The patient has had a prior history of kidney stones, and apparently [place] years [place]. He became symptomatic with a left ureteral stone in early April, 2018. CT was performed on 09/18/2016 revealing a 4 millimeter left ureteral stone. He had a subsequent KUB after that confirming the presence of a calcification in the area of the left UVJ.  He presents now for ureteroscopic mgmt.   Past Medical History:  Diagnosis Date  . GERD (gastroesophageal reflux disease)   . Left ureteral stone     Past Surgical History:  Procedure Laterality Date  . HEMORRHOID SURGERY  2008 approx.    Home Medications:    Allergies: No Known Allergies  History reviewed. No pertinent family history.  Social History:  reports that he has never smoked. He has never used smokeless tobacco. He reports that he does not drink alcohol or use drugs.  ROS: A complete review of systems was performed.  All systems are negative except for pertinent findings as noted.  Physical Exam:  Vital signs in last 24 hours: Temp:  [98.5 F (36.9 C)] 98.5 F (36.9 C) (06/28 1317) Pulse Rate:  [57] 57 (06/28 1317) Resp:  [16] 16 (06/28 1317) BP: (134)/(84) 134/84 (06/28 1317) SpO2:  [100 %] 100 % (06/28 1317) Weight:  [82.3 kg (181 lb 8 oz)] 82.3 kg (181 lb 8 oz) (06/28 1317) Constitutional:  Alert and oriented, No acute distress Cardiovascular: Regular rate and rhythm, No JVD Respiratory: Normal respiratory effort, Lungs clear bilaterally GI: Abdomen is soft, nontender, nondistended, no abdominal masses Genitourinary: No CVAT. Normal male phallus, testes are descended bilaterally and non-tender and without masses, scrotum is normal in appearance without lesions or masses, perineum is normal on inspection. Lymphatic: No lymphadenopathy Neurologic: Grossly intact, no focal deficits Psychiatric: Normal mood and  affect  Laboratory Data:  No results for input(s): WBC, HGB, HCT, PLT in the last 72 hours.  No results for input(s): NA, K, CL, GLUCOSE, BUN, CALCIUM, CREATININE in the last 72 hours.  Invalid input(s): CO3   No results found for this or any previous visit (from the past 24 hour(s)). No results found for this or any previous visit (from the past 240 hour(s)).  Renal Function: No results for input(s): CREATININE in the last 168 hours. CrCl cannot be calculated (Patient's most recent lab result is older than the maximum 21 days allowed.).  Radiologic Imaging: No results found.  Impression/Assessment:  Lt ureteral stone  Plan:  Cysto, left retrograde pyelogram, left ureteroscopy, HLL and extraction of left ureteral stone, possible J2 stent

## 2016-12-06 NOTE — Anesthesia Procedure Notes (Signed)
Procedure Name: LMA Insertion Date/Time: 12/06/2016 3:04 PM Performed by: Renella CunasHAZEL, Lenard Kampf D Pre-anesthesia Checklist: Patient identified, Emergency Drugs available, Suction available and Patient being monitored Patient Re-evaluated:Patient Re-evaluated prior to inductionOxygen Delivery Method: Circle system utilized Preoxygenation: Pre-oxygenation with 100% oxygen Intubation Type: IV induction Ventilation: Mask ventilation without difficulty LMA: LMA with gastric port inserted LMA Size: 4.0 Number of attempts: 1 Airway Equipment and Method: Bite block Placement Confirmation: positive ETCO2 Tube secured with: Tape Dental Injury: Teeth and Oropharynx as per pre-operative assessment

## 2016-12-06 NOTE — Anesthesia Postprocedure Evaluation (Signed)
Anesthesia Post Note  Patient: Marc Tate  Procedure(s) Performed: Procedure(s) (LRB): CYSTOSCOPY WITH RETROGRADE PYELOGRAM, URETEROSCOPY, STONE EXTRACTION (Left)     Patient location during evaluation: PACU Anesthesia Type: General Level of consciousness: awake and alert Pain management: pain level controlled Vital Signs Assessment: post-procedure vital signs reviewed and stable Respiratory status: spontaneous breathing, nonlabored ventilation, respiratory function stable and patient connected to nasal cannula oxygen Cardiovascular status: blood pressure returned to baseline and stable Postop Assessment: no signs of nausea or vomiting Anesthetic complications: no    Last Vitals:  Vitals:   12/06/16 1615 12/06/16 1755  BP: 119/77 136/86  Pulse: (!) 56 (!) 51  Resp: 14 16  Temp:  36.5 C    Last Pain:  Vitals:   12/06/16 1754  TempSrc:   PainSc: 4                  Ryan P Ellender

## 2016-12-06 NOTE — Interval H&P Note (Signed)
History and Physical Interval Note:  12/06/2016 2:43 PM  Marc Tate  has presented today for surgery, with the diagnosis of LEFT URETERAL STONE  The various methods of treatment have been discussed with the patient and family. After consideration of risks, benefits and other options for treatment, the patient has consented to  Procedure(s): CYSTOSCOPY WITH RETROGRADE PYELOGRAM, URETEROSCOPY, STONE EXTRACTION AND POSSIBLE STENT PLACEMENT (Left) HOLMIUM LASER APPLICATION (Left) as a surgical intervention .  The patient's history has been reviewed, patient examined, no change in status, stable for surgery.  I have reviewed the patient's chart and labs.  Questions were answered to the patient's satisfaction.     [name]   

## 2016-12-07 ENCOUNTER — Encounter (HOSPITAL_BASED_OUTPATIENT_CLINIC_OR_DEPARTMENT_OTHER): Payer: Self-pay | Admitting: Urology

## 2016-12-17 ENCOUNTER — Encounter (HOSPITAL_BASED_OUTPATIENT_CLINIC_OR_DEPARTMENT_OTHER): Payer: Self-pay | Admitting: Urology

## 2017-05-23 ENCOUNTER — Telehealth: Payer: Self-pay

## 2017-05-23 ENCOUNTER — Encounter: Payer: Self-pay | Admitting: Physician Assistant

## 2017-05-23 ENCOUNTER — Ambulatory Visit (INDEPENDENT_AMBULATORY_CARE_PROVIDER_SITE_OTHER): Payer: BLUE CROSS/BLUE SHIELD

## 2017-05-23 ENCOUNTER — Ambulatory Visit: Payer: BLUE CROSS/BLUE SHIELD | Admitting: Physician Assistant

## 2017-05-23 VITALS — BP 127/78 | HR 85 | Temp 98.4°F | Resp 18 | Wt 189.8 lb

## 2017-05-23 DIAGNOSIS — K5900 Constipation, unspecified: Secondary | ICD-10-CM | POA: Diagnosis not present

## 2017-05-23 DIAGNOSIS — R109 Unspecified abdominal pain: Secondary | ICD-10-CM

## 2017-05-23 DIAGNOSIS — R079 Chest pain, unspecified: Secondary | ICD-10-CM | POA: Diagnosis not present

## 2017-05-23 DIAGNOSIS — M549 Dorsalgia, unspecified: Secondary | ICD-10-CM | POA: Diagnosis not present

## 2017-05-23 LAB — POCT CBC
Granulocyte percent: 67.6 %G (ref 37–80)
HEMATOCRIT: 37 % — AB (ref 43.5–53.7)
HEMOGLOBIN: 12.2 g/dL — AB (ref 14.1–18.1)
Lymph, poc: 1.3 (ref 0.6–3.4)
MCH: 29.2 pg (ref 27–31.2)
MCHC: 33 g/dL (ref 31.8–35.4)
MCV: 88.4 fL (ref 80–97)
MID (cbc): 0.3 (ref 0–0.9)
MPV: 7.2 fL (ref 0–99.8)
POC GRANULOCYTE: 3.4 (ref 2–6.9)
POC LYMPH PERCENT: 26.3 %L (ref 10–50)
POC MID %: 6.1 % (ref 0–12)
Platelet Count, POC: 204 10*3/uL (ref 142–424)
RBC: 4.19 M/uL — AB (ref 4.69–6.13)
RDW, POC: 12.8 %
WBC: 5.1 10*3/uL (ref 4.6–10.2)

## 2017-05-23 LAB — POCT URINALYSIS DIP (MANUAL ENTRY)
BILIRUBIN UA: NEGATIVE
Glucose, UA: NEGATIVE mg/dL
Ketones, POC UA: NEGATIVE mg/dL
Leukocytes, UA: NEGATIVE
Nitrite, UA: NEGATIVE
Protein Ur, POC: NEGATIVE mg/dL
RBC UA: NEGATIVE
SPEC GRAV UA: 1.025 (ref 1.010–1.025)
UROBILINOGEN UA: 0.2 U/dL
pH, UA: 5.5 (ref 5.0–8.0)

## 2017-05-23 LAB — POC MICROSCOPIC URINALYSIS (UMFC): Mucus: ABSENT

## 2017-05-23 LAB — GLUCOSE, POCT (MANUAL RESULT ENTRY): POC Glucose: 127 mg/dl — AB (ref 70–99)

## 2017-05-23 MED ORDER — POLYETHYLENE GLYCOL 3350 17 GM/SCOOP PO POWD: 17.0000 g | Freq: Two times a day (BID) | ORAL | 1 refills | Status: DC | PRN

## 2017-05-23 NOTE — Patient Instructions (Addendum)
Please hydrate well with 64 oz of water if not more.   I will check labs to confirm that your labs are normal.  This appears to be constipation.  Constipation, Adult Constipation is when a person:  Poops (has a bowel movement) fewer times in a week than normal.  Has a hard time pooping.  Has poop that is dry, hard, or bigger than normal.  Follow these instructions at home: Eating and drinking   Eat foods that have a lot of fiber, such as: ? Fresh fruits and vegetables. ? Whole grains. ? Beans.  Eat less of foods that are high in fat, low in fiber, or overly processed, such as: ? [place] fries. ? Hamburgers. ? Cookies. ? Candy. ? Soda.  Drink enough fluid to keep your pee (urine) clear or pale yellow. General instructions  Exercise regularly or as told by your doctor.  Go to the restroom when you feel like you need to poop. Do not hold it in.  Take over-the-counter and prescription medicines only as told by your doctor. These include any fiber supplements.  Do pelvic floor retraining exercises, such as: ? Doing deep breathing while relaxing your lower belly (abdomen). ? Relaxing your pelvic floor while pooping.  Watch your condition for any changes.  Keep all follow-up visits as told by your doctor. This is important. Contact a doctor if:  You have pain that gets worse.  You have a fever.  You have not pooped for 4 days.  You throw up (vomit).  You are not hungry.  You lose weight.  You are bleeding from the anus.  You have thin, pencil-like poop (stool). Get help right away if:  You have a fever, and your symptoms suddenly get worse.  You leak poop or have blood in your poop.  Your belly feels hard or bigger than normal (is bloated).  You have very bad belly pain.  You feel dizzy or you faint. This information is not intended to replace advice given to you by your health care provider. Make sure you discuss any questions you have with your health  care provider. Document Released: 11/14/2007 Document Revised: 12/16/2015 Document Reviewed: 11/16/2015 Elsevier Interactive Patient Education  2017 [employer]

## 2017-05-23 NOTE — Telephone Encounter (Signed)
Pt.'s son called to report pt. Feels like he has another kidney stone. Son reports pt. Does not speak [place]. Appointment made for today.

## 2017-05-23 NOTE — Progress Notes (Addendum)
[facility] [address], [place] [address] [phone]  Date:  05/23/2017   Name:  Marc Tate   DOB:  1970-11-21   MRN:  [id number]  PCP:  Wendie Agreste, MD    History of Present Illness:  Marc Tate is a 46 y.o. male patient who presents to PCP with  Chief Complaint  Patient presents with  . Back Pain    Back and chest pain onset x2 days in the morning, hx of kidney stone surgery 6 months ago, denies injury.      Patient reports 2 days of pain that is on his sides bilaterally of his back and wraps around through his chest.  He can feel it lower as well.  He has no fever.  No hematuria, dysuria, or frequency, or anuria.  He has small bm last this morning but none in the last 4 days.  No bloody or back stool.  No palpations, sob, or leg swelling.  No dizziness or association with exertion.   There are no active problems to display for this patient.   Past Medical History:  Diagnosis Date  . GERD (gastroesophageal reflux disease)   . Left ureteral stone     Past Surgical History:  Procedure Laterality Date  . CYSTOSCOPY WITH RETROGRADE PYELOGRAM, URETEROSCOPY AND STENT PLACEMENT Left 12/06/2016   Procedure: CYSTOSCOPY WITH RETROGRADE PYELOGRAM, URETEROSCOPY, STONE EXTRACTION;  Surgeon: Franchot Gallo, MD;  Location: [facility];  Service: Urology;  Laterality: Left;  . HEMORRHOID SURGERY  2008 approx.    Social History   Tobacco Use  . Smoking status: Never Smoker  . Smokeless tobacco: Never Used  Substance Use Topics  . Alcohol use: No  . Drug use: No    No family history on file.  No Known Allergies  Medication list has been reviewed and updated.  Current Outpatient Medications on File Prior to Visit  Medication Sig Dispense Refill  . ibuprofen (ADVIL,MOTRIN) 200 MG tablet Take 200 mg by mouth every 6 (six) hours as needed.    Marland Kitchen omeprazole (PRILOSEC) 20 MG capsule Take 1 capsule (20 mg total) by mouth daily. 30 capsule 1   . ondansetron (ZOFRAN ODT) 4 MG disintegrating tablet Take 1 tablet (4 mg total) by mouth every 8 (eight) hours as needed for nausea or vomiting. 10 tablet 0  . PRESCRIPTION MEDICATION PRESCRIPTION PAIN MEDICATION -- NAME UNKNOWN    . sulfamethoxazole-trimethoprim (BACTRIM DS,SEPTRA DS) 800-160 MG tablet Take 1 tablet by mouth 2 (two) times daily. 6 tablet 0   No current facility-administered medications on file prior to visit.     ROS ROS otherwise unremarkable unless listed above.  Physical Examination: BP 127/78   Pulse 85   Temp 98.4 F (36.9 C)   Resp (!) 28   Wt 189 lb 12.8 oz (86.1 kg)   SpO2 98%   BMI 37.07 kg/m  Ideal Body Weight:    Physical Exam  Constitutional: He is oriented to person, place, and time. He appears well-developed and well-nourished. No distress.  HENT:  Head: Normocephalic and atraumatic.  Eyes: Conjunctivae and EOM are normal. Pupils are equal, round, and reactive to light.  Cardiovascular: Normal rate and regular rhythm. Exam reveals no friction rub.  No murmur heard. Pulmonary/Chest: Effort normal. No respiratory distress.  Abdominal: Soft. Normal appearance and bowel sounds are normal. There is generalized tenderness and tenderness in the suprapubic area and left lower quadrant. There is CVA tenderness (right>left).  Neurological: He  is alert and oriented to person, place, and time.  Skin: Skin is warm and dry. He is not diaphoretic.  Psychiatric: He has a normal mood and affect. His behavior is normal.    Results for orders placed or performed in visit on 05/23/17  POCT urinalysis dipstick  Result Value Ref Range   Color, UA yellow yellow   Clarity, UA clear clear   Glucose, UA negative negative mg/dL   Bilirubin, UA negative negative   Ketones, POC UA negative negative mg/dL   Spec Grav, UA 1.025 1.010 - 1.025   Blood, UA negative negative   pH, UA 5.5 5.0 - 8.0   Protein Ur, POC negative negative mg/dL   Urobilinogen, UA 0.2 0.2 or  1.0 E.U./dL   Nitrite, UA Negative Negative   Leukocytes, UA Negative Negative  POCT CBC  Result Value Ref Range   WBC 5.1 4.6 - 10.2 K/uL   Lymph, poc 1.3 0.6 - 3.4   POC LYMPH PERCENT 26.3 10 - 50 %L   MID (cbc) 0.3 0 - 0.9   POC MID % 6.1 0 - 12 %M   POC Granulocyte 3.4 2 - 6.9   Granulocyte percent 67.6 37 - 80 %G   RBC 4.19 (A) 4.69 - 6.13 M/uL   Hemoglobin 12.2 (A) 14.1 - 18.1 g/dL   HCT, POC 37.0 (A) 43.5 - 53.7 %   MCV 88.4 80 - 97 fL   MCH, POC 29.2 27 - 31.2 pg   MCHC 33.0 31.8 - 35.4 g/dL   RDW, POC 12.8 %   Platelet Count, POC 204 142 - 424 K/uL   MPV 7.2 0 - 99.8 fL  POCT Microscopic Urinalysis ([facility])  Result Value Ref Range   WBC,UR,HPF,POC None None WBC/hpf   RBC,UR,HPF,POC None None RBC/hpf   Bacteria None None, Too numerous to count   Mucus Absent Absent   Epithelial Cells, UR Per Microscopy None None, Too numerous to count cells/hpf  POCT glucose (manual entry)  Result Value Ref Range   POC Glucose 127 (A) 70 - 99 mg/dl   Dg Abd 1 View  Result Date: 05/23/2017 CLINICAL DATA:  Bilateral flank pain.  Possible constipation. EXAM: ABDOMEN - 1 VIEW COMPARISON:  KUB of December 06, 2016 FINDINGS: The colonic stool burden is mildly increased. There is no small or large bowel obstructive pattern. There is no evidence of a fecal impaction. No definite calcified kidney stones are observed. No definite ureteral stones are observed. There is a stable phlebolith in the left aspect of the pelvic cavity. The bony structures are unremarkable. IMPRESSION: Moderately increase colonic stool burden may reflect constipation in the appropriate clinical setting. No acute intra-abdominal abnormality is observed. Electronically Signed   By: David  Martinique M.D.   On: 05/23/2017 17:00     Assessment and Plan: Marc Tate is a 46 y.o. male who is here today for cc of  Chief Complaint  Patient presents with  . Back Pain    Back and chest pain onset x2 days in the morning, hx of kidney  stone surgery 6 months ago, denies injury.    Discussed alarming symptoms to warrant immediate return.  advised to follow up in 4 days.   Will treat for constipation. Constipation, unspecified constipation type - Plan: polyethylene glycol powder (GLYCOLAX/MIRALAX) powder, CMP14+EGFR  Chest pain, unspecified type - Plan: EKG 12-Lead, POCT CBC  Other acute back pain - Plan: POCT urinalysis dipstick, POCT CBC, DG Abd 1 View, CANCELED: Urinalysis Dipstick  Flank pain - Plan: POCT urinalysis dipstick, POCT CBC, POCT Microscopic Urinalysis ([facility]), POCT glucose (manual entry), DG Abd 1 View, CMP14+EGFR  Ivar Drape, PA-C [facility] 12/15/20189:10 PM

## 2017-05-24 LAB — CMP14+EGFR
ALT: 31 IU/L (ref 0–44)
AST: 18 IU/L (ref 0–40)
Albumin/Globulin Ratio: 1.8 (ref 1.2–2.2)
Albumin: 4.6 g/dL (ref 3.5–5.5)
Alkaline Phosphatase: 56 IU/L (ref 39–117)
BILIRUBIN TOTAL: 0.3 mg/dL (ref 0.0–1.2)
BUN / CREAT RATIO: 14 (ref 9–20)
BUN: 11 mg/dL (ref 6–24)
CHLORIDE: 100 mmol/L (ref 96–106)
CO2: 28 mmol/L (ref 20–29)
Calcium: 9.6 mg/dL (ref 8.7–10.2)
Creatinine, Ser: 0.78 mg/dL (ref 0.76–1.27)
GFR calc Af Amer: 127 mL/min/{1.73_m2} (ref >59)
GFR calc non Af Amer: 110 mL/min/{1.73_m2} (ref >59)
GLUCOSE: 136 mg/dL — AB (ref 65–99)
Globulin, Total: 2.5 g/dL (ref 1.5–4.5)
Potassium: 4.3 mmol/L (ref 3.5–5.2)
Sodium: 140 mmol/L (ref 134–144)
Total Protein: 7.1 g/dL (ref 6.0–8.5)

## 2017-05-29 ENCOUNTER — Ambulatory Visit: Payer: BLUE CROSS/BLUE SHIELD | Admitting: Physician Assistant

## 2017-06-06 ENCOUNTER — Ambulatory Visit: Payer: BLUE CROSS/BLUE SHIELD | Admitting: Physician Assistant

## 2017-06-10 ENCOUNTER — Encounter: Payer: Self-pay | Admitting: Physician Assistant

## 2017-06-10 ENCOUNTER — Ambulatory Visit (INDEPENDENT_AMBULATORY_CARE_PROVIDER_SITE_OTHER): Payer: BLUE CROSS/BLUE SHIELD | Admitting: Physician Assistant

## 2017-06-10 ENCOUNTER — Other Ambulatory Visit: Payer: Self-pay

## 2017-06-10 DIAGNOSIS — K5900 Constipation, unspecified: Secondary | ICD-10-CM

## 2017-06-10 MED ORDER — POLYETHYLENE GLYCOL 3350 17 GM/SCOOP PO POWD: 17.0000 g | Freq: Every day | ORAL | 1 refills | Status: DC

## 2017-06-10 NOTE — Patient Instructions (Addendum)

## 2017-06-10 NOTE — Progress Notes (Signed)
[facility] [address], [place] [phone]  Date:  06/10/2017   Name:  Marc Tate   DOB:  03/09/1971   MRN:  [id number]  PCP:  Shade FloodGreene, Jeffrey R, MD    History of Present Illness:  Marc Tate is a 46 y.o. male patient who presents to PCP with  Chief Complaint  Patient presents with  . Follow-up    constipation     He follows up, stating that  Feels like "some strange thing is in my body".   His diet consist of .  He drinks tea and milk in the morning.  Eats green beans.  Bread and milk at night.  He does not get vegetables in daily.  1.5 bottle per day of water.  No coffee intake.   No black or bloody stool that he can report.   No urinary pain, hematuria, or abnormal frequency.      There are no active problems to display for this patient.   Past Medical History:  Diagnosis Date  . GERD (gastroesophageal reflux disease)   . Left ureteral stone     Past Surgical History:  Procedure Laterality Date  . CYSTOSCOPY WITH RETROGRADE PYELOGRAM, URETEROSCOPY AND STENT PLACEMENT Left 12/06/2016   Procedure: CYSTOSCOPY WITH RETROGRADE PYELOGRAM, URETEROSCOPY, STONE EXTRACTION;  Surgeon: Marcine Matarahlstedt, Stephen, MD;  [facility];  Service: Urology;  Laterality: Left;  . HEMORRHOID SURGERY  2008 approx.    Social History   Tobacco Use  . Smoking status: Never Smoker  . Smokeless tobacco: Never Used  Substance Use Topics  . Alcohol use: No  . Drug use: No    No family history on file.  No Known Allergies  Medication list has been reviewed and updated.  Current Outpatient Medications on File Prior to Visit  Medication Sig Dispense Refill  . ibuprofen (ADVIL,MOTRIN) 200 MG tablet Take 200 mg by mouth every 6 (six) hours as needed.    . polyethylene glycol powder (GLYCOLAX/MIRALAX) powder Take 17 g by mouth 2 (two) times daily as needed. 3350 g 1  . PRESCRIPTION MEDICATION PRESCRIPTION PAIN MEDICATION -- NAME UNKNOWN    .  omeprazole (PRILOSEC) 20 MG capsule Take 1 capsule (20 mg total) by mouth daily. (Patient not taking: Reported on 05/23/2017) 30 capsule 1  . ondansetron (ZOFRAN ODT) 4 MG disintegrating tablet Take 1 tablet (4 mg total) by mouth every 8 (eight) hours as needed for nausea or vomiting. (Patient not taking: Reported on 05/23/2017) 10 tablet 0  . sulfamethoxazole-trimethoprim (BACTRIM DS,SEPTRA DS) 800-160 MG tablet Take 1 tablet by mouth 2 (two) times daily. (Patient not taking: Reported on 05/23/2017) 6 tablet 0   No current facility-administered medications on file prior to visit.     ROS ROS otherwise unremarkable unless listed above.  Physical Examination: BP 116/70   Pulse 83   Temp 98.2 F (36.8 C) (Oral)   Resp 16   Ht 5' (1.524 m)   Wt 190 lb (86.2 kg)   SpO2 100%   BMI 37.11 kg/m  Ideal Body Weight: Weight in (lb) to have BMI = 25: 127.7  Physical Exam  Constitutional: He is oriented to person, place, and time. He appears well-developed and well-nourished. No distress.  HENT:  Head: Normocephalic and atraumatic.  Eyes: Conjunctivae and EOM are normal. Pupils are equal, round, and reactive to light.  Cardiovascular: Normal rate.  Pulmonary/Chest: Effort normal. No respiratory distress.  Abdominal: Soft. Normal appearance and bowel sounds  are normal. There is no tenderness.  Neurological: He is alert and oriented to person, place, and time.  Skin: Skin is warm and dry. He is not diaphoretic.  Psychiatric: He has a normal mood and affect. His behavior is normal.     Assessment and Plan: Marc Tate is a 46 y.o. male who is here today for cc of  Chief Complaint  Patient presents with  . Follow-up    constipation   The constipation is improved.  Advised constipation diet, with increasing fiber, and discussed vegetable intake.  Also advised he can do the miralax once per day. Advised to return within 3 months for a physical exam. Constipation, unspecified constipation  type - Plan: polyethylene glycol powder (GLYCOLAX/MIRALAX) powder  Trena PlattStephanie Thorvald Orsino, PA-C [facility] 1/4/20198:55 AM

## 2017-06-13 ENCOUNTER — Encounter: Payer: Self-pay | Admitting: Radiology

## 2017-09-02 ENCOUNTER — Ambulatory Visit (INDEPENDENT_AMBULATORY_CARE_PROVIDER_SITE_OTHER): Payer: BLUE CROSS/BLUE SHIELD | Admitting: Physician Assistant

## 2017-09-02 ENCOUNTER — Encounter: Payer: Self-pay | Admitting: Physician Assistant

## 2017-09-02 ENCOUNTER — Other Ambulatory Visit: Payer: Self-pay

## 2017-09-02 VITALS — BP 119/80 | HR 66 | Temp 97.6°F | Resp 16 | Ht 66.5 in | Wt 184.8 lb

## 2017-09-02 DIAGNOSIS — Z13220 Encounter for screening for lipoid disorders: Secondary | ICD-10-CM

## 2017-09-02 DIAGNOSIS — Z0000 Encounter for general adult medical examination without abnormal findings: Secondary | ICD-10-CM | POA: Diagnosis not present

## 2017-09-02 DIAGNOSIS — Z23 Encounter for immunization: Secondary | ICD-10-CM

## 2017-09-02 DIAGNOSIS — Z125 Encounter for screening for malignant neoplasm of prostate: Secondary | ICD-10-CM

## 2017-09-02 DIAGNOSIS — Z13228 Encounter for screening for other metabolic disorders: Secondary | ICD-10-CM | POA: Diagnosis not present

## 2017-09-02 DIAGNOSIS — Z130 Encounter for screening for diseases of the blood and blood-forming organs and certain disorders involving the immune mechanism: Secondary | ICD-10-CM

## 2017-09-02 DIAGNOSIS — Z1389 Encounter for screening for other disorder: Secondary | ICD-10-CM | POA: Diagnosis not present

## 2017-09-02 DIAGNOSIS — Z1329 Encounter for screening for other suspected endocrine disorder: Secondary | ICD-10-CM

## 2017-09-02 LAB — POCT URINALYSIS DIP (MANUAL ENTRY)
Bilirubin, UA: NEGATIVE
Glucose, UA: NEGATIVE mg/dL
Ketones, POC UA: NEGATIVE mg/dL
Leukocytes, UA: NEGATIVE
NITRITE UA: NEGATIVE
PH UA: 5 (ref 5.0–8.0)
PROTEIN UA: NEGATIVE mg/dL
RBC UA: NEGATIVE
SPEC GRAV UA: 1.02 (ref 1.010–1.025)
UROBILINOGEN UA: 0.2 U/dL

## 2017-09-02 NOTE — Progress Notes (Signed)
PRIMARY CARE AT [place], [place] [address] [phone]  Date:  09/02/2017   Name:  Marc Tate   DOB:  12/14/1970   MRN:  [id number]  PCP:  Joretta Bachelor, PA    History of Present Illness:  Marc Tate is a 47 y.o. male patient who presents to PCP with  Chief Complaint  Patient presents with  . Annual Exam    complete physical       DIET: He has no restrictions.  No diet.  Pastas, salads, beans and peas.  He eats chicken.  Water intake.  32 oz of water  BM: he has some constipation.  He has some no black or bloody stool  URINATION: no dysuria or hematuria.  No abnormal frequency.    SLEEP: not bad.  5-6 hours  SOCIAL ACTIVITY: patient does mostly work.     There are no active problems to display for this patient.   Past Medical History:  Diagnosis Date  . Chronic kidney disease   . Depression   . GERD (gastroesophageal reflux disease)   . Left ureteral stone     Past Surgical History:  Procedure Laterality Date  . CYSTOSCOPY WITH RETROGRADE PYELOGRAM, URETEROSCOPY AND STENT PLACEMENT Left 12/06/2016   Procedure: CYSTOSCOPY WITH RETROGRADE PYELOGRAM, URETEROSCOPY, STONE EXTRACTION;  Surgeon: Franchot Gallo, MD;  Location: [facility];  Service: Urology;  Laterality: Left;  . HEMORRHOID SURGERY  2008 approx.    Social History   Tobacco Use  . Smoking status: Never Smoker  . Smokeless tobacco: Never Used  Substance Use Topics  . Alcohol use: No  . Drug use: No    Family History  Problem Relation Age of Onset  . Cancer Mother     No Known Allergies  Medication list has been reviewed and updated.  Current Outpatient Medications on File Prior to Visit  Medication Sig Dispense Refill  . ibuprofen (ADVIL,MOTRIN) 200 MG tablet Take 200 mg by mouth every 6 (six) hours as needed.    Marland Kitchen omeprazole (PRILOSEC) 20 MG capsule Take 1 capsule (20 mg total) by mouth daily. 30 capsule 1  . ondansetron (ZOFRAN ODT) 4 MG  disintegrating tablet Take 1 tablet (4 mg total) by mouth every 8 (eight) hours as needed for nausea or vomiting. (Patient not taking: Reported on 05/23/2017) 10 tablet 0  . polyethylene glycol powder (GLYCOLAX/MIRALAX) powder Take 17 g by mouth daily. (Patient not taking: Reported on 09/02/2017) 3350 g 1  . PRESCRIPTION MEDICATION PRESCRIPTION PAIN MEDICATION -- NAME UNKNOWN    . sulfamethoxazole-trimethoprim (BACTRIM DS,SEPTRA DS) 800-160 MG tablet Take 1 tablet by mouth 2 (two) times daily. (Patient not taking: Reported on 05/23/2017) 6 tablet 0   No current facility-administered medications on file prior to visit.     Review of Systems  Constitutional: Negative for chills and fever.  HENT: Negative for ear discharge, ear pain and sore throat.   Eyes: Negative for blurred vision and double vision.  Respiratory: Negative for cough, shortness of breath and wheezing.   Cardiovascular: Negative for chest pain, palpitations and leg swelling.  Gastrointestinal: Negative for diarrhea, nausea and vomiting.  Genitourinary: Negative for dysuria, frequency and hematuria.  Musculoskeletal: Positive for joint pain (shoulder pains for the last several years).  Skin: Negative for itching and rash.  Neurological: Negative for dizziness and headaches.   ROS otherwise unremarkable unless listed above.  Physical Examination: BP 119/80 (BP Location: Right Arm, Patient Position: Sitting, Cuff Size: Normal)  Pulse 66   Temp 97.6 F (36.4 C) (Oral)   Resp 16   Ht 5' 6.5" (1.689 m)   Wt 184 lb 12.8 oz (83.8 kg)   SpO2 100%   BMI 29.38 kg/m  Ideal Body Weight: Weight in (lb) to have BMI = 25: 156.9  Physical Exam  Constitutional: He is oriented to person, place, and time. He appears well-developed and well-nourished. No distress.  HENT:  Head: Normocephalic and atraumatic.  Right Ear: Tympanic membrane, external ear and ear canal normal.  Left Ear: Tympanic membrane, external ear and ear canal  normal.  Eyes: Pupils are equal, round, and reactive to light. Conjunctivae and EOM are normal.  Cardiovascular: Normal rate and regular rhythm. Exam reveals no friction rub.  No murmur heard. Pulmonary/Chest: Effort normal. No respiratory distress. He has no wheezes.  Abdominal: Soft. Bowel sounds are normal. He exhibits no distension and no mass. There is no tenderness.  Musculoskeletal: Normal range of motion. He exhibits no edema or tenderness.  Neurological: He is alert and oriented to person, place, and time. He displays normal reflexes.  Skin: Skin is warm and dry. He is not diaphoretic.  Psychiatric: He has a normal mood and affect. His behavior is normal.     Assessment and Plan: Marc Tate is a 47 y.o. male who is here today for cc of  Chief Complaint  Patient presents with  . Annual Exam    complete physical    Flu vaccine given today.   Advised to follow up for chronic shoulder pain. Annual physical exam - Plan: Flu Vaccine QUAD 36+ mos IM, Tdap vaccine greater than or equal to 7yo IM, POCT urinalysis dipstick, CBC, CMP14+EGFR, TSH, PSA, Lipid panel  Screening for lipid disorders - Plan: Lipid panel  Screening for deficiency anemia - Plan: CBC  Screening for metabolic disorder - Plan: CMP14+EGFR  Screening for thyroid disorder - Plan: TSH  Screening for prostate cancer - Plan: PSA  Need for Tdap vaccination - Plan: Tdap vaccine greater than or equal to 7yo IM  Need for influenza vaccination - Plan: Flu Vaccine QUAD 36+ mos IM  Screening for hematuria or proteinuria - Plan: POCT urinalysis dipstick  Ivar Drape, PA-C [facility] 3/25/20198:44 AM

## 2017-09-02 NOTE — Patient Instructions (Addendum)

## 2017-09-03 LAB — LIPID PANEL
CHOLESTEROL TOTAL: 228 mg/dL — AB (ref 100–199)
Chol/HDL Ratio: 4.1 ratio (ref 0.0–5.0)
HDL: 55 mg/dL (ref >39)
LDL Calculated: 143 mg/dL — ABNORMAL HIGH (ref 0–99)
TRIGLYCERIDES: 150 mg/dL — AB (ref 0–149)
VLDL Cholesterol Cal: 30 mg/dL (ref 5–40)

## 2017-09-03 LAB — CMP14+EGFR
A/G RATIO: 1.6 (ref 1.2–2.2)
ALT: 22 IU/L (ref 0–44)
AST: 19 IU/L (ref 0–40)
Albumin: 4.6 g/dL (ref 3.5–5.5)
Alkaline Phosphatase: 56 IU/L (ref 39–117)
BUN/Creatinine Ratio: 18 (ref 9–20)
BUN: 17 mg/dL (ref 6–24)
Bilirubin Total: 0.3 mg/dL (ref 0.0–1.2)
CALCIUM: 9.6 mg/dL (ref 8.7–10.2)
CO2: 25 mmol/L (ref 20–29)
CREATININE: 0.92 mg/dL (ref 0.76–1.27)
Chloride: 102 mmol/L (ref 96–106)
GFR calc Af Amer: 116 mL/min/{1.73_m2} (ref >59)
GFR calc non Af Amer: 100 mL/min/{1.73_m2} (ref >59)
GLUCOSE: 102 mg/dL — AB (ref 65–99)
Globulin, Total: 2.9 g/dL (ref 1.5–4.5)
POTASSIUM: 4.5 mmol/L (ref 3.5–5.2)
Sodium: 140 mmol/L (ref 134–144)
Total Protein: 7.5 g/dL (ref 6.0–8.5)

## 2017-09-03 LAB — PSA: PROSTATE SPECIFIC AG, SERUM: 0.6 ng/mL (ref 0.0–4.0)

## 2017-09-03 LAB — CBC
Hematocrit: 39.3 % (ref 37.5–51.0)
Hemoglobin: 13.6 g/dL (ref 13.0–17.7)
MCH: 29.4 pg (ref 26.6–33.0)
MCHC: 34.6 g/dL (ref 31.5–35.7)
MCV: 85 fL (ref 79–97)
PLATELETS: 227 10*3/uL (ref 150–379)
RBC: 4.63 x10E6/uL (ref 4.14–5.80)
RDW: 13.6 % (ref 12.3–15.4)
WBC: 4.3 10*3/uL (ref 3.4–10.8)

## 2017-09-03 LAB — TSH: TSH: 4.96 u[IU]/mL — ABNORMAL HIGH (ref 0.450–4.500)

## 2017-09-11 ENCOUNTER — Encounter: Payer: Self-pay | Admitting: Physician Assistant

## 2017-09-12 ENCOUNTER — Other Ambulatory Visit: Payer: Self-pay | Admitting: Physician Assistant

## 2017-09-12 DIAGNOSIS — R7989 Other specified abnormal findings of blood chemistry: Secondary | ICD-10-CM

## 2017-09-17 ENCOUNTER — Ambulatory Visit: Payer: BLUE CROSS/BLUE SHIELD | Admitting: Family Medicine

## 2017-09-23 ENCOUNTER — Ambulatory Visit (INDEPENDENT_AMBULATORY_CARE_PROVIDER_SITE_OTHER): Payer: BLUE CROSS/BLUE SHIELD

## 2017-09-23 ENCOUNTER — Ambulatory Visit: Payer: BLUE CROSS/BLUE SHIELD | Admitting: Physician Assistant

## 2017-09-23 VITALS — BP 118/72 | HR 83 | Temp 97.6°F | Resp 16 | Ht 66.0 in | Wt 184.0 lb

## 2017-09-23 DIAGNOSIS — M25512 Pain in left shoulder: Secondary | ICD-10-CM

## 2017-09-23 MED ORDER — CYCLOBENZAPRINE HCL 10 MG PO TABS: 5.0000 mg | ORAL_TABLET | Freq: Three times a day (TID) | ORAL | 0 refills | Status: DC | PRN

## 2017-09-23 MED ORDER — MELOXICAM 15 MG PO TABS: 15.0000 mg | ORAL_TABLET | Freq: Every day | ORAL | 0 refills | Status: DC

## 2017-09-23 NOTE — Progress Notes (Signed)
PRIMARY CARE AT [facility] [address] [place] [address] [phone]  Date:  09/23/2017   Name:  Marc Tate   DOB:  03-Dec-1970   MRN:  [id number]  PCP:  Garnetta Buddy, PA    History of Present Illness:  Marc Tate is a 47 y.o. male patient who presents to PCP with  Chief Complaint  Patient presents with  . Shoulder Pain    pt states he has been dealing with the ppain for over 10 years, pt states the [ain goes from his back to neck at times     He notes the pain at the left side of his nec k and stretches down the shoulder and upper arm.  He will have pain exacerbated with heavy work.  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 He can feel this in his upper back just under the left shoulder blade.   No numbness or tingling.   No swelling.   Upper back under scapula, where he feels it at the left side of neck.  Pain with turning his head.    Ibuprofen 400mg  during a day, without consistency.    There are no active problems to display for this patient.   Past Medical History:  Diagnosis Date  . Chronic kidney disease   . Depression   . GERD (gastroesophageal reflux disease)   . Left ureteral stone     Past Surgical History:  Procedure Laterality Date  . CYSTOSCOPY WITH RETROGRADE PYELOGRAM, URETEROSCOPY AND STENT PLACEMENT Left 12/06/2016   Procedure: CYSTOSCOPY WITH RETROGRADE PYELOGRAM, URETEROSCOPY, STONE EXTRACTION;  Surgeon: Marcine Matar, MD;  Location: [facility];  Service: Urology;  Laterality: Left;  . HEMORRHOID SURGERY  2008 approx.    Social History   Tobacco Use  . Smoking status: Never Smoker  . Smokeless tobacco: Never Used  Substance Use Topics  . Alcohol use: No  . Drug use: No    Family History  Problem Relation Age of Onset  . Cancer Mother     No Known Allergies  Medication list has been reviewed and updated.  Current Outpatient  Medications on File Prior to Visit  Medication Sig Dispense Refill  . ibuprofen (ADVIL,MOTRIN) 200 MG tablet Take 200 mg by mouth every 6 (six) hours as needed.    Marland Kitchen omeprazole (PRILOSEC) 20 MG capsule Take 1 capsule (20 mg total) by mouth daily. 30 capsule 1  . polyethylene glycol powder (GLYCOLAX/MIRALAX) powder Take 17 g by mouth daily. 3350 g 1  . ondansetron (ZOFRAN ODT) 4 MG disintegrating tablet Take 1 tablet (4 mg total) by mouth every 8 (eight) hours as needed for nausea or vomiting. (Patient not taking: Reported on 05/23/2017) 10 tablet 0  . PRESCRIPTION MEDICATION PRESCRIPTION PAIN MEDICATION -- NAME UNKNOWN    . sulfamethoxazole-trimethoprim (BACTRIM DS,SEPTRA DS) 800-160 MG tablet Take 1 tablet by mouth 2 (two) times daily. (Patient not taking: Reported on 05/23/2017) 6 tablet 0   No current facility-administered medications on file prior to visit.     ROS ROS otherwise unremarkable unless listed above.  Physical Examination: BP 118/72   Pulse 83   Temp 97.6 F (36.4 C) (Oral)   Resp 16   Ht 5\' 6"  (1.676 m)   Wt 184 lb (83.5 kg)   SpO2 98%   BMI 29.70 kg/m  Ideal Body Weight: Weight in (lb) to have BMI = 25: 154.6  Physical Exam  Constitutional: He is oriented to person, place, and time. He appears well-developed and well-nourished. No distress.  HENT:  Head: Normocephalic and atraumatic.  Eyes: Pupils are equal, round, and reactive to light. Conjunctivae and EOM are normal.  Cardiovascular: Normal rate.  Pulmonary/Chest: Effort normal. No respiratory distress.  Musculoskeletal:  Left sided trapezius tenderness to palpation and and tenderness medial and inferiorly to the scapula.  There is pain incited with external rotation with very minimal limit of rom.  Internal rotation with rom and slight weakness to the strength.   Clavicular to shoulder joint without tenderness throughout this part of exam.  Neurological: He is alert and oriented to person, place, and time.   Skin: Skin is warm and dry. He is not diaphoretic.  Psychiatric: He has a normal mood and affect. His behavior is normal.   Dg Shoulder Left  Result Date: 09/23/2017 CLINICAL DATA:  47 y/o  M; left shoulder pain for years. EXAM: LEFT SHOULDER - 2+ VIEW COMPARISON:  01/10/2015 chest radiograph FINDINGS: No acute fracture or dislocation. Glenohumeral joint is maintained. 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 IMPRESSION: 1. 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 2.  No acute bony or articular abnormality identified. Electronically Signed   By: Mitzi HansenLance  Furusawa-Stratton M.D.   On: 09/23/2017 14:54     Assessment and Plan: Marc Tate is a 47 y.o. male who is here today for cc of  Chief Complaint  Patient presents with  . Shoulder Pain    pt states he has been dealing with the ppain for over 10 years, pt states the [ain goes from his back to neck at times   --nsaid use and msk relaxant.  Icing regimen, and stretches discussed.  Acute pain of left shoulder - Plan: DG Shoulder Left, meloxicam (MOBIC) 15 MG tablet, cyclobenzaprine (FLEXERIL) 10 MG tablet  Marc PlattStephanie Adja Ruff, PA-C [facility] 4/18/201911:58 AM

## 2017-09-23 NOTE — Patient Instructions (Addendum)
Please do not take the mobic with naproxen or ibuprofen.  You can take tylenol. I would like you to ice the shoulder and back three times per day after your stretches. Please pick three pictures, and perform then three times per day.  You will ice after you do each set of the three stretches.   Xray shows some changes consistent with time and aging.   Flexeril is sedating so do not operate heavy machinery, such as driving with this.  You can take this at night.   Shoulder Exercises Ask your health care provider which exercises are safe for you. Do exercises exactly as told by your health care provider and adjust them as directed. It is normal to feel mild stretching, pulling, tightness, or discomfort as you do these exercises, but you should stop right away if you feel sudden pain or your pain gets worse.Do not begin these exercises until told by your health care provider. RANGE OF MOTION EXERCISES These exercises warm up your muscles and joints and improve the movement and flexibility of your shoulder. These exercises also help to relieve pain, numbness, and tingling. These exercises involve stretching your injured shoulder directly. Exercise A: Pendulum  1. Stand near a wall or a surface that you can hold onto for balance. 2. Bend at the waist and let your left / right arm hang straight down. Use your other arm to support you. Keep your back straight and do not lock your knees. 3. Relax your left / right arm and shoulder muscles, and move your hips and your trunk so your left / right arm swings freely. Your arm should swing because of the motion of your body, not because you are using your arm or shoulder muscles. 4. Keep moving your body so your arm swings in the following directions, as told by your health care provider: ? Side to side. ? Forward and backward. ? In clockwise and counterclockwise circles. 5. Continue each motion for __________ seconds, or for as long as told by your health  care provider. 6. Slowly return to the starting position. Repeat __________ times. Complete this exercise __________ times a day. Exercise B:Flexion, Standing  1. Stand and hold a broomstick, a cane, or a similar object. Place your hands a little more than shoulder-width apart on the object. Your left / right hand should be palm-up, and your other hand should be palm-down. 2. Keep your elbow straight and keep your shoulder muscles relaxed. Push the stick down with your healthy arm to raise your left / right arm in front of your body, and then over your head until you feel a stretch in your shoulder. ? Avoid shrugging your shoulder while you raise your arm. Keep your shoulder blade tucked down toward the middle of your back. 3. Hold for __________ seconds. 4. Slowly return to the starting position. Repeat __________ times. Complete this exercise __________ times a day. Exercise C: Abduction, Standing 1. Stand and hold a broomstick, a cane, or a similar object. Place your hands a little more than shoulder-width apart on the object. Your left / right hand should be palm-up, and your other hand should be palm-down. 2. While keeping your elbow straight and your shoulder muscles relaxed, push the stick across your body toward your left / right side. Raise your left / right arm to the side of your body and then over your head until you feel a stretch in your shoulder. ? Do not raise your arm above shoulder height,  unless your health care provider tells you to do that. ? Avoid shrugging your shoulder while you raise your arm. Keep your shoulder blade tucked down toward the middle of your back. 3. Hold for __________ seconds. 4. Slowly return to the starting position. Repeat __________ times. Complete this exercise __________ times a day. Exercise D:Internal Rotation  1. Place your left / right hand behind your back, palm-up. 2. Use your other hand to dangle an exercise band, a towel, or a similar  object over your shoulder. Grasp the band with your left / right hand so you are holding onto both ends. 3. Gently pull up on the band until you feel a stretch in the front of your left / right shoulder. ? Avoid shrugging your shoulder while you raise your arm. Keep your shoulder blade tucked down toward the middle of your back. 4. Hold for __________ seconds. 5. Release the stretch by letting go of the band and lowering your hands. Repeat __________ times. Complete this exercise __________ times a day. STRETCHING EXERCISES These exercises warm up your muscles and joints and improve the movement and flexibility of your shoulder. These exercises also help to relieve pain, numbness, and tingling. These exercises are done using your healthy shoulder to help stretch the muscles of your injured shoulder. Exercise E: Research officer, political party (External Rotation and Abduction)  1. Stand in a doorway with one of your feet slightly in front of the other. This is called a staggered stance. If you cannot reach your forearms to the door frame, stand facing a corner of a room. 2. Choose one of the following positions as told by your health care provider: ? Place your hands and forearms on the door frame above your head. ? Place your hands and forearms on the door frame at the height of your head. ? Place your hands on the door frame at the height of your elbows. 3. Slowly move your weight onto your front foot until you feel a stretch across your chest and in the front of your shoulders. Keep your head and chest upright and keep your abdominal muscles tight. 4. Hold for __________ seconds. 5. To release the stretch, shift your weight to your back foot. Repeat __________ times. Complete this stretch __________ times a day. Exercise F:Extension, Standing 1. Stand and hold a broomstick, a cane, or a similar object behind your back. ? Your hands should be a little wider than shoulder-width apart. ? Your palms should face  away from your back. 2. Keeping your elbows straight and keeping your shoulder muscles relaxed, move the stick away from your body until you feel a stretch in your shoulder. ? Avoid shrugging your shoulders while you move the stick. Keep your shoulder blade tucked down toward the middle of your back. 3. Hold for __________ seconds. 4. Slowly return to the starting position. Repeat __________ times. Complete this exercise __________ times a day. STRENGTHENING EXERCISES These exercises build strength and endurance in your shoulder. Endurance is the ability to use your muscles for a long time, even after they get tired. Exercise G:External Rotation  1. Sit in a stable chair without armrests. 2. Secure an exercise band at elbow height on your left / right side. 3. Place a soft object, such as a folded towel or a small pillow, between your left / right upper arm and your body to move your elbow a few inches away (about 10 cm) from your side. 4. Hold the end of the band so  it is tight and there is no slack. 5. Keeping your elbow pressed against the soft object, move your left / right forearm out, away from your abdomen. Keep your body steady so only your forearm moves. 6. Hold for __________ seconds. 7. Slowly return to the starting position. Repeat __________ times. Complete this exercise __________ times a day. Exercise H:Shoulder Abduction  1. Sit in a stable chair without armrests, or stand. 2. Hold a __________ weight in your left / right hand, or hold an exercise band with both hands. 3. Start with your arms straight down and your left / right palm facing in, toward your body. 4. Slowly lift your left / right hand out to your side. Do not lift your hand above shoulder height unless your health care provider tells you that this is safe. ? Keep your arms straight. ? Avoid shrugging your shoulder while you do this movement. Keep your shoulder blade tucked down toward the middle of your  back. 5. Hold for __________ seconds. 6. Slowly lower your arm, and return to the starting position. Repeat __________ times. Complete this exercise __________ times a day. Exercise I:Shoulder Extension 1. Sit in a stable chair without armrests, or stand. 2. Secure an exercise band to a stable object in front of you where it is at shoulder height. 3. Hold one end of the exercise band in each hand. Your palms should face each other. 4. Straighten your elbows and lift your hands up to shoulder height. 5. Step back, away from the secured end of the exercise band, until the band is tight and there is no slack. 6. Squeeze your shoulder blades together as you pull your hands down to the sides of your thighs. Stop when your hands are straight down by your sides. Do not let your hands go behind your body. 7. Hold for __________ seconds. 8. Slowly return to the starting position. Repeat __________ times. Complete this exercise __________ times a day. Exercise J:Standing Shoulder Row 1. Sit in a stable chair without armrests, or stand. 2. Secure an exercise band to a stable object in front of you so it is at waist height. 3. Hold one end of the exercise band in each hand. Your palms should be in a thumbs-up position. 4. Bend each of your elbows to an "L" shape (about 90 degrees) and keep your upper arms at your sides. 5. Step back until the band is tight and there is no slack. 6. Slowly pull your elbows back behind you. 7. Hold for __________ seconds. 8. Slowly return to the starting position. Repeat __________ times. Complete this exercise __________ times a day. Exercise K:Shoulder Press-Ups  1. Sit in a stable chair that has armrests. Sit upright, with your feet flat on the floor. 2. Put your hands on the armrests so your elbows are bent and your fingers are pointing forward. Your hands should be about even with the sides of your body. 3. Push down on the armrests and use your arms to lift  yourself off of the chair. Straighten your elbows and lift yourself up as much as you comfortably can. ? Move your shoulder blades down, and avoid letting your shoulders move up toward your ears. ? Keep your feet on the ground. As you get stronger, your feet should support less of your body weight as you lift yourself up. 4. Hold for __________ seconds. 5. Slowly lower yourself back into the chair. Repeat __________ times. Complete this exercise __________ times a day.  Exercise L: Wall Push-Ups  1. Stand so you are facing a stable wall. Your feet should be about one arm-length away from the wall. 2. Lean forward and place your palms on the wall at shoulder height. 3. Keep your feet flat on the floor as you bend your elbows and lean forward toward the wall. 4. Hold for __________ seconds. 5. Straighten your elbows to push yourself back to the starting position. Repeat __________ times. Complete this exercise __________ times a day. This information is not intended to replace advice given to you by your health care provider. Make sure you discuss any questions you have with your health care provider. Document Released: 04/11/2005 Document Revised: 02/20/2016 Document Reviewed: 02/06/2015 Elsevier Interactive Patient Education  2018 [employer]   IF you received an x-ray today, you will receive an invoice from [facility]. Please contact [facility] at [phone] with questions or concerns regarding your invoice.   IF you received labwork today, you will receive an invoice from [facility]. Please contact [facility] at [phone] with questions or concerns regarding your invoice.   Our billing staff will not be able to assist you with questions regarding bills from these companies.  You will be contacted with the lab results as soon as they are available. The fastest way to get your results is to activate your My Chart account. Instructions are located on the last page of  this paperwork. If you have not heard from us regarding the results in 2 weeks, please contact this office.

## 2017-12-03 ENCOUNTER — Ambulatory Visit: Payer: BLUE CROSS/BLUE SHIELD | Admitting: Physician Assistant

## 2017-12-03 ENCOUNTER — Other Ambulatory Visit: Payer: Self-pay

## 2017-12-03 ENCOUNTER — Encounter: Payer: Self-pay | Admitting: Physician Assistant

## 2017-12-03 VITALS — BP 120/68 | HR 69 | Temp 99.0°F | Resp 18 | Ht 67.32 in | Wt 178.0 lb

## 2017-12-03 DIAGNOSIS — K5900 Constipation, unspecified: Secondary | ICD-10-CM | POA: Diagnosis not present

## 2017-12-03 DIAGNOSIS — Z87442 Personal history of urinary calculi: Secondary | ICD-10-CM

## 2017-12-03 DIAGNOSIS — R300 Dysuria: Secondary | ICD-10-CM

## 2017-12-03 DIAGNOSIS — R109 Unspecified abdominal pain: Secondary | ICD-10-CM

## 2017-12-03 DIAGNOSIS — M545 Low back pain, unspecified: Secondary | ICD-10-CM

## 2017-12-03 DIAGNOSIS — M62838 Other muscle spasm: Secondary | ICD-10-CM | POA: Diagnosis not present

## 2017-12-03 LAB — POCT URINALYSIS DIP (MANUAL ENTRY)
BILIRUBIN UA: NEGATIVE
GLUCOSE UA: NEGATIVE mg/dL
Ketones, POC UA: NEGATIVE mg/dL
Leukocytes, UA: NEGATIVE
Nitrite, UA: NEGATIVE
Protein Ur, POC: NEGATIVE mg/dL
RBC UA: NEGATIVE
SPEC GRAV UA: 1.02 (ref 1.010–1.025)
UROBILINOGEN UA: 0.2 U/dL
pH, UA: 5.5 (ref 5.0–8.0)

## 2017-12-03 MED ORDER — CYCLOBENZAPRINE HCL 5 MG PO TABS: 5.0000 mg | ORAL_TABLET | Freq: Three times a day (TID) | ORAL | 1 refills | Status: DC | PRN

## 2017-12-03 MED ORDER — MELOXICAM 7.5 MG PO TABS: 7.5000 mg | ORAL_TABLET | Freq: Every day | ORAL | 0 refills | Status: AC

## 2017-12-03 MED ORDER — TAMSULOSIN HCL 0.4 MG PO CAPS: 0.4000 mg | ORAL_CAPSULE | Freq: Every day | ORAL | 3 refills | Status: DC

## 2017-12-03 MED ORDER — DOCUSATE SODIUM 100 MG PO CAPS: 100.0000 mg | ORAL_CAPSULE | Freq: Two times a day (BID) | ORAL | 0 refills | Status: AC

## 2017-12-03 NOTE — Progress Notes (Signed)
Marc Tate  MRN: [id number] DOB: 1970/11/24  PCP: Garnetta Buddy, PA  Chief Complaint  Patient presents with  . kidney issues    kidney stones had surgery but still happening     Subjective:  Patient is a 47 year old with h/o nephrolithiasis (s/p cystoscopy w/ stone extraction 2018) presenting for evaluation of kidney issues. He notes that beginning around May 25th which was after about a month of fasting for Ramadan, he began having similar symptoms to those felt prior to his kidney stone removal: fatigue, back/flank pain, and leg pain. He first had a kidney stone in 2003 while still in [place] for which he received medication causing him to pass stone in urine.  He has tried to increase water and vegetable intake but he still admits to inadequate water intake as evidenced by yellow or light brown urine. Of note he does admit to increased back pain when bending.  He would also like to know if drinking bottled water is recommended for him.  Of note, he would like a work note including excuse for today as well as medical permission to use the restroom more than his job's allotment of 2 times per shift.  He was on trial of flomax 09/24/16 but still needed surgery for removal 12/06/17.   Labs/imaging results history: 05/23/17 urine dipstick = normal with normal microscopy.  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 Of note, interpreter did not appear to be completely and literally interpret provider statements as questions needed repeating due to lack of communication.  Review of Systems  Constitutional: Negative for chills and fever.  Respiratory: Negative for shortness  of breath.   Cardiovascular: Positive for chest pain (msk related to work lifting). Negative for palpitations.  Gastrointestinal: Negative for constipation, diarrhea and vomiting.  Genitourinary: Positive for difficulty urinating (intermittent), dysuria, flank pain (left sided) and frequency. Negative for hematuria.  Musculoskeletal: Positive for back pain (radiation to legs b/l, in thighs).  Neurological: Negative for headaches.    There are no active problems to display for this patient.   Current Outpatient Medications on File Prior to Visit  Medication Sig Dispense Refill  . cyclobenzaprine (FLEXERIL) 10 MG tablet Take 0.5-1 tablets (5-10 mg total) by mouth 3 (three) times daily as needed. (Patient not taking: Reported on 12/03/2017) 30 tablet 0  . ibuprofen (ADVIL,MOTRIN) 200 MG tablet Take 200 mg by mouth every 6 (six) hours as needed.     No current facility-administered medications on file prior to visit.     No Known Allergies  Past Medical History:  Diagnosis Date  . Chronic kidney disease   . Depression   . GERD (gastroesophageal reflux disease)   . Left ureteral stone    Social History   Social History Narrative   ** Merged History Encounter **       Social History   Tobacco Use  . Smoking status: Never Smoker  . Smokeless tobacco: Never Used  Substance Use Topics  . Alcohol use: No  . Drug use: No   family history includes Cancer in his mother.     Objective:  BP 120/68   Pulse 69   Temp 99 F (37.2  C) (Oral)   Resp 18   Ht 5' 7.32" (1.71 m)   Wt 178 lb (80.7 kg)   SpO2 100%   BMI 27.61 kg/m  Body mass index is 27.61 kg/m.  Wt Readings from Last 3 Encounters:  12/03/17 178 lb (80.7 kg)  09/23/17 184 lb (83.5 kg)  09/02/17 184 lb 12.8 oz (83.8 kg)    Physical Exam  Constitutional: He is oriented to person, place, and time. He appears well-developed and well-nourished.  HENT:  Head: Normocephalic and atraumatic.  Right Ear: External ear  normal.  Left Ear: External ear normal.  Eyes: Pupils are equal, round, and reactive to light. Conjunctivae are normal.  Neck: Normal range of motion. Neck supple.  Cardiovascular: Normal rate, regular rhythm and normal heart sounds.  No murmur heard. Pulmonary/Chest: Effort normal and breath sounds normal. No stridor. He has no wheezes. He has no rales.  Abdominal: Soft. Bowel sounds are normal. There is tenderness (LLQ and RUQ). There is CVA tenderness (mild on the left but more on the muscle spasm area).  Musculoskeletal: He exhibits tenderness (b/l quadriceps).       Lumbar back: He exhibits tenderness (right sided) and spasm (left paraspinal muscle in the upper lumbar spine). He exhibits normal range of motion.       Back:  Lymphadenopathy:    He has no cervical adenopathy.  Neurological: He is alert and oriented to person, place, and time.  Skin: Skin is warm and dry.  Psychiatric: He has a normal mood and affect. His behavior is normal. Judgment and thought content normal.   Results for orders placed or performed in visit on 12/03/17  Urine Culture  Result Value Ref Range   Urine Culture, Routine Final report    Organism ID, Bacteria No growth   GC/Chlamydia Probe Amp  Result Value Ref Range   Chlamydia trachomatis, NAA Negative Negative   Neisseria gonorrhoeae by PCR Negative Negative  Urinalysis, microscopic only  Result Value Ref Range   WBC, UA 0-5 0 - 5 /hpf   RBC, UA 0-2 0 - 2 /hpf   Epithelial Cells (non renal) None seen 0 - 10 /hpf   Casts None seen None seen /lpf   Mucus, UA Present Not Estab.   Bacteria, UA None seen None seen/Few  POCT urinalysis dipstick  Result Value Ref Range   Color, UA yellow yellow   Clarity, UA clear clear   Glucose, UA negative negative mg/dL   Bilirubin, UA negative negative   Ketones, POC UA negative negative mg/dL   Spec Grav, UA [id number] - 1.025   Blood, UA negative negative   pH, UA 5.5 5.0 - 8.0   Protein Ur, POC  negative negative mg/dL   Urobilinogen, UA 0.2 0.2 or 1.0 E.U./dL   Nitrite, UA Negative Negative   Leukocytes, UA Negative Negative     Assessment and Plan :  Left flank pain - Plan: POCT urinalysis dipstick, Urinalysis, microscopic only, tamsulosin (FLOMAX) 0.4 MG CAPS capsule - unclear etiology - pt has history of kidney stones in the absence of hematuria - we will o ahead and treat as if this could be the case - though I suspect this is more related to his MSK symptoms and possibly being aggravated by constipation  History of kidney stones - Plan: POCT urinalysis dipstick, Urinalysis, microscopic only, tamsulosin (FLOMAX) 0.4 MG CAPS capsule  Constipation, unspecified constipation type - Plan: docusate sodium (COLACE) 100 MG capsule - pt having hard stool  with minimal water intake - increase water, colace to help with hardness  Muscle spasm - Plan: cyclobenzaprine (FLEXERIL) 5 MG tablet, meloxicam (MOBIC) 7.5 MG tablet - heat and stretches to the back due to suspect this is actually the cause of back pain as during exam with low back ROM he complains of the same pain that brought him in today  Acute left-sided low back pain without sciatica - Plan: meloxicam (MOBIC) 7.5 MG tablet  Dysuria - Plan: GC/Chlamydia Probe Amp, Urine Culture - check labs wonder if related to concentrated urine due to possible dehydration  Patient verbalized to me that they understand the following: diagnosis, what is being done for them, what to expect and what should be done at home.  Their questions have been answered.  See after visit summary for patient specific instructions.  Benny Lennert PA-C  Primary Care at [facility] 12/05/2017 1:20 AM  Please note: Portions of this report may have been transcribed using dragon voice recognition software. Every effort was made to ensure accuracy; however, inadvertent computerized transcription errors may be present.

## 2017-12-03 NOTE — Patient Instructions (Addendum)
Your problems today could be coming from 3 issues we are going to address them all 1.  Constipation can cause a lot of your problems we are going to use Colace to soften your stool in your to increase your water intake  2.  Dehydration please drink water into your urine is clear like water 3.  We are going to start medication to help you if you do have been to have a kidney stone 4.  I suspect this is related to muscle spasms we gave you medicine for that today    IF you received an x-ray today, you will receive an invoice from [facility]. Please contact [facility] at [phone] with questions or concerns regarding your invoice.   IF you received labwork today, you will receive an invoice from [facility]. Please contact [facility] at [phone] with questions or concerns regarding your invoice.   Our billing staff will not be able to assist you with questions regarding bills from these companies.  You will be contacted with the lab results as soon as they are available. The fastest way to get your results is to activate your My Chart account. Instructions are located on the last page of this paperwork. If you have not heard from us regarding the results in 2 weeks, please contact this office.

## 2017-12-04 LAB — URINALYSIS, MICROSCOPIC ONLY
Bacteria, UA: NONE SEEN
Casts: NONE SEEN /lpf
Epithelial Cells (non renal): NONE SEEN /hpf (ref 0–10)

## 2017-12-04 LAB — URINE CULTURE: ORGANISM ID, BACTERIA: NO GROWTH

## 2017-12-04 LAB — GC/CHLAMYDIA PROBE AMP
CHLAMYDIA, DNA PROBE: NEGATIVE
Neisseria gonorrhoeae by PCR: NEGATIVE

## 2017-12-05 ENCOUNTER — Encounter: Payer: Self-pay | Admitting: Physician Assistant

## 2017-12-17 ENCOUNTER — Ambulatory Visit: Payer: BLUE CROSS/BLUE SHIELD | Admitting: Physician Assistant

## 2017-12-17 ENCOUNTER — Other Ambulatory Visit: Payer: Self-pay

## 2017-12-17 ENCOUNTER — Encounter: Payer: Self-pay | Admitting: Physician Assistant

## 2017-12-17 VITALS — BP 114/72 | HR 82 | Temp 99.2°F | Resp 18 | Ht 67.32 in | Wt 178.8 lb

## 2017-12-17 DIAGNOSIS — Z87442 Personal history of urinary calculi: Secondary | ICD-10-CM | POA: Diagnosis not present

## 2017-12-17 DIAGNOSIS — K5900 Constipation, unspecified: Secondary | ICD-10-CM

## 2017-12-17 DIAGNOSIS — R109 Unspecified abdominal pain: Secondary | ICD-10-CM | POA: Diagnosis not present

## 2017-12-17 DIAGNOSIS — R39198 Other difficulties with micturition: Secondary | ICD-10-CM | POA: Diagnosis not present

## 2017-12-17 NOTE — Patient Instructions (Signed)
     IF you received an x-ray today, you will receive an invoice from [facility]. Please contact [facility] at [phone] with questions or concerns regarding your invoice.   IF you received labwork today, you will receive an invoice from [facility]. Please contact [facility] at [phone] with questions or concerns regarding your invoice.   Our billing staff will not be able to assist you with questions regarding bills from these companies.  You will be contacted with the lab results as soon as they are available. The fastest way to get your results is to activate your My Chart account. Instructions are located on the last page of this paperwork. If you have not heard from us regarding the results in 2 weeks, please contact this office.     

## 2017-12-17 NOTE — Progress Notes (Signed)
Marc Tate  MRN: [id number] DOB: June 26, 1970  PCP: Patient, No Pcp Per  Chief Complaint  Patient presents with  . Flank Pain    follow up feeling a little better     Subjective:  Pt presents to clinic for recheck   Of his left flank pain.  He felt like his pain was better until yesterday when he ate white beans and felt more pain in his back and abdomin but he does admit he felt the pain yesterday was a little different.  Today the pain that he had yesterday is much better.  He is still really worried about his kidneys from his past experience with no blood in urine and stone that needed surgery.  He is still using Flomax daily and he feels like he has had improved urinary symptoms.  He had complained about decreased urine stream, and decreased ability to get stream started but this has improved since starting Flomax.  Constipation - still there - not worse -using something OTC to help with constipation but he is unsure what it is he is also using Colace that was given to him.  Back pain with movement - trying to do exercise - not sure how that has changed - he bends and stands a lot of work  Patient has some questions about sexual function.  He seems to have a early ejaculation.  He knows at his last visit he was checked for urinary infections and he never got the results of those and he would like to discuss those.  History is obtained by patient though stratus interpretor Armando Reichert [phone].  Review of Systems  Genitourinary: Positive for difficulty urinating (Decreased stream as well as difficulty starting stream improved with Flomax). Negative for discharge, dysuria and hematuria.  Musculoskeletal: Positive for back pain.    Patient Active Problem List   Diagnosis Date Noted  . History of kidney stones 12/17/2017    Current Outpatient Medications on File Prior to Visit  Medication Sig Dispense Refill  . cyclobenzaprine (FLEXERIL) 5 MG tablet Take 1 tablet (5 mg total) by mouth 3  (three) times daily as needed for muscle spasms. 30 tablet 1  . docusate sodium (COLACE) 100 MG capsule Take 1 capsule (100 mg total) by mouth 2 (two) times daily. 10 capsule 0  . ibuprofen (ADVIL,MOTRIN) 200 MG tablet Take 200 mg by mouth every 6 (six) hours as needed.    . meloxicam (MOBIC) 7.5 MG tablet Take 1-2 tablets (7.5-15 mg total) by mouth daily. 30 tablet 0  . tamsulosin (FLOMAX) 0.4 MG CAPS capsule Take 1 capsule (0.4 mg total) by mouth daily. 30 capsule 3   No current facility-administered medications on file prior to visit.     No Known Allergies  Past Medical History:  Diagnosis Date  . Chronic kidney disease   . Depression   . GERD (gastroesophageal reflux disease)   . Left ureteral stone    Social History   Social History Narrative   ** Merged History Encounter **       Social History   Tobacco Use  . Smoking status: Never Smoker  . Smokeless tobacco: Never Used  Substance Use Topics  . Alcohol use: No  . Drug use: No   family history includes Cancer in his mother.     Objective:  BP 114/72   Pulse 82   Temp 99.2 F (37.3 C) (Oral)   Resp 18   Ht 5' 7.32" (1.71 m)   Wt  178 lb 12.8 oz (81.1 kg)   SpO2 99%   BMI 27.74 kg/m  Body mass index is 27.74 kg/m.  Wt Readings from Last 3 Encounters:  12/17/17 178 lb 12.8 oz (81.1 kg)  12/03/17 178 lb (80.7 kg)  09/23/17 184 lb (83.5 kg)    Physical Exam  Constitutional: He is oriented to person, place, and time.  HENT:  Head: Normocephalic and atraumatic.  Right Ear: External ear normal.  Left Ear: External ear normal.  Eyes: Conjunctivae are normal.  Neck: Normal range of motion.  Cardiovascular: Normal rate, regular rhythm and normal heart sounds.  No murmur heard. Pulmonary/Chest: Effort normal and breath sounds normal. He has no wheezes.  Abdominal: Soft. Normal appearance and bowel sounds are normal.  Musculoskeletal:       Thoracic back: He exhibits spasm (Most significant on the left  at this time, mild tenderness to palpation over mid thoracic paralumbar muscle on the left). He exhibits normal range of motion, no tenderness and no bony tenderness.  Neurological: He is alert and oriented to person, place, and time.  Skin: Skin is warm and dry.  Psychiatric: Judgment normal.    Assessment and Plan :  Left flank pain - Plan: Urinalysis, dipstick only, Urinalysis, microscopic only, US Renal -patient has had interesting presentation of kidney stones in the past with no hematuria and just left flank pain.  Due to this he is concerned about his kidney which is appropriate.  We will do a renal ultrasound to look to see if there is a stone in place due to improvement in his pain.  I suspect if there is no stone present this is likely musculoskeletal due to working deconditioning.    History of kidney stones  Constipation, unspecified constipation type patient states his -about the same encourage continued use of Colace and MiraLAX.  Discussed with patient the role of constipation with back and belly pain as well as urinary irritation if the stool was pressing on the bladder.  Decreased urine stream -patient states he drinks a lot of water from his specific gravity last week it did not appear that he drink enough.  He states that he has increased his water intake since his last visit.  He feels like he has decreased urine stream and inability get his urine stream started has improved since he started the Flomax.  Discussed with patient premature ejaculation concerns and what he can do regarding this other than medications.    Patient verbalized to me that they understand the following: diagnosis, what is being done for them, what to expect and what should be done at home.  Their questions have been answered.  See after visit summary for patient specific instructions.  Benny LennertSarah Kathlee Barnhardt PA-C  Primary Care at [facility] 12/18/2017 7:37 AM  Please note: Portions of  this report may have been transcribed using dragon voice recognition software. Every effort was made to ensure accuracy; however, inadvertent computerized transcription errors may be present.

## 2017-12-18 ENCOUNTER — Encounter: Payer: Self-pay | Admitting: Physician Assistant

## 2017-12-18 LAB — URINALYSIS, MICROSCOPIC ONLY: Casts: NONE SEEN /lpf

## 2017-12-18 LAB — URINALYSIS, DIPSTICK ONLY
BILIRUBIN UA: NEGATIVE
GLUCOSE, UA: NEGATIVE
Ketones, UA: NEGATIVE
LEUKOCYTES UA: NEGATIVE
NITRITE UA: NEGATIVE
PH UA: 6 (ref 5.0–7.5)
PROTEIN UA: NEGATIVE
RBC UA: NEGATIVE
SPEC GRAV UA: 1.018 (ref 1.005–1.030)
UUROB: 0.2 mg/dL (ref 0.2–1.0)

## 2017-12-31 ENCOUNTER — Ambulatory Visit: Payer: BLUE CROSS/BLUE SHIELD | Admitting: Physician Assistant

## 2018-01-28 ENCOUNTER — Other Ambulatory Visit: Payer: Self-pay

## 2018-01-28 ENCOUNTER — Encounter: Payer: Self-pay | Admitting: Physician Assistant

## 2018-01-28 ENCOUNTER — Ambulatory Visit: Payer: BLUE CROSS/BLUE SHIELD | Admitting: Physician Assistant

## 2018-01-28 VITALS — BP 132/70 | HR 83 | Temp 99.5°F | Resp 18 | Ht 67.32 in | Wt 170.0 lb

## 2018-01-28 DIAGNOSIS — M62838 Other muscle spasm: Secondary | ICD-10-CM

## 2018-01-28 DIAGNOSIS — R3915 Urgency of urination: Secondary | ICD-10-CM | POA: Diagnosis not present

## 2018-01-28 DIAGNOSIS — R109 Unspecified abdominal pain: Secondary | ICD-10-CM

## 2018-01-28 DIAGNOSIS — S29012D Strain of muscle and tendon of back wall of thorax, subsequent encounter: Secondary | ICD-10-CM

## 2018-01-28 MED ORDER — METAXALONE 800 MG PO TABS: 400.0000 mg | ORAL_TABLET | Freq: Three times a day (TID) | ORAL | 0 refills | Status: AC

## 2018-01-28 MED ORDER — TAMSULOSIN HCL 0.4 MG PO CAPS: 0.4000 mg | ORAL_CAPSULE | Freq: Every day | ORAL | 1 refills | Status: AC

## 2018-01-28 NOTE — Patient Instructions (Addendum)
If you have lab work done today you will be contacted with your lab results within the next 2 weeks.  If you have not heard from us then please contact us. The fastest way to get your results is to register for My Chart.   IF you received an x-ray today, you will receive an invoice from [facility]. Please contact [facility] at [phone] with questions or concerns regarding your invoice.   IF you received labwork today, you will receive an invoice from [facility]. Please contact [facility] at [phone] with questions or concerns regarding your invoice.   Our billing staff will not be able to assist you with questions regarding bills from these companies.  You will be contacted with the lab results as soon as they are available. The fastest way to get your results is to activate your My Chart account. Instructions are located on the last page of this paperwork. If you have not heard from us regarding the results in 2 weeks, please contact this office.      Mid-Back Strain Rehab Ask your health care provider which exercises are safe for you. Do exercises exactly as told by your health care provider and adjust them as directed. It is normal to feel mild stretching, pulling, tightness, or discomfort as you do these exercises, but you should stop right away if you feel sudden pain or your pain gets worse. Do not begin these exercises until told by your health care provider. Stretching and range of motion exercises This exercise warms up your muscles and joints and improves the movement and flexibility of your back and shoulders. This exercise also help to relieve pain. Exercise A: Chest and spine stretch  1. Lie down on your back on a firm surface. 2. Roll a towel or a small blanket so it is about 4 inches (10 cm) in diameter. 3. Put the towel lengthwise under the middle of your back so it is under your spine, but not under your shoulder blades. 4. To increase the  stretch, you may put your hands behind your head and let your elbows fall to your sides. 5. Hold for __________ seconds. Repeat exercise __________ times. Complete this exercise __________ times a day. Strengthening exercises These exercises build strength and endurance in your back and your shoulder blade muscles. Endurance is the ability to use your muscles for a long time, even after they get tired. Exercise B: Alternating arm and leg raises  1. Get on your hands and knees on a firm surface. If you are on a hard floor, you may want to use padding to cushion your knees, such as an exercise mat. 2. Line up your arms and legs. Your hands should be below your shoulders, and your knees should be below your hips. 3. Lift your left leg behind you. At the same time, raise your right arm and straighten it in front of you. ? Do not lift your leg higher than your hip. ? Do not lift your arm higher than your shoulder. ? Keep your abdominal and back muscles tight. ? Keep your hips facing the ground. ? Do not arch your back. ? Keep your balance carefully, and do not hold your breath. 4. Hold for __________ seconds. 5. Slowly return to the starting position and repeat with your right leg and your left arm. Repeat __________ times. Complete this exercise __________ times a day. Exercise C: Straight arm rows ( shoulder extension) 1. Stand with your feet shoulder  width apart. 2. Secure an exercise band to a stable object in front of you so the band is at or above shoulder height. 3. Hold one end of the exercise band in each hand. 4. Straighten your elbows and lift your hands up to shoulder height. 5. Step back, away from the secured end of the exercise band, until the band stretches. 6. Squeeze your shoulder blades together and pull your hands down to the sides of your thighs. Stop when your hands are straight down by your sides. Do not let your hands go behind your body. 7. Hold for __________  seconds. 8. Slowly return to the starting position. Repeat __________ times. Complete this exercise __________ times a day. Exercise D: Shoulder external rotation, prone 1. Lie on your abdomen on a firm bed so your left / right forearm hangs over the edge of the bed and your upper arm is on the bed, straight out from your body. ? Your elbow should be bent. ? Your palm should be facing your feet. 2. If instructed, hold a __________ weight in your hand. 3. Squeeze your shoulder blade toward the middle of your back. Do not let your shoulder lift toward your ear. 4. Keep your elbow bent in an "L" shape (90 degrees) while you slowly move your forearm up toward the ceiling. Move your forearm up to the height of the bed, toward your head. ? Your upper arm should not move. ? At the top of the movement, your palm should face the floor. 5. Hold for __________ seconds. 6. Slowly return to the starting position and relax your muscles. Repeat __________ times. Complete this exercise __________ times a day. Exercise E: Scapular retraction and external rotation, rowing  1. Sit in a stable chair without armrests, or stand. 2. Secure an exercise band to a stable object in front of you so it is at shoulder height. 3. Hold one end of the exercise band in each hand. 4. Bring your arms out straight in front of you. 5. Step back, away from the secured end of the exercise band, until the band stretches. 6. Pull the band backward. As you do this, bend your elbows and squeeze your shoulder blades together, but avoid letting the rest of your body move. Do not let your shoulders lift up toward your ears. 7. Stop when your elbows are at your sides or slightly behind your body. 8. Hold for __________ seconds. 9. Slowly straighten your arms to return to the starting position. Repeat __________ times. Complete this exercise __________ times a day. Posture and body mechanics  Body mechanics refers to the movements and  positions of your body while you do your daily activities. Posture is part of body mechanics. Good posture and healthy body mechanics can help to relieve stress in your body's tissues and joints. Good posture means that your spine is in its natural S-curve position (your spine is neutral), your shoulders are pulled back slightly, and your head is not tipped forward. The following are general guidelines for applying improved posture and body mechanics to your everyday activities. Standing   When standing, keep your spine neutral and your feet about hip-width apart. Keep a slight bend in your knees. Your ears, shoulders, and hips should line up.  When you do a task in which you lean forward while standing in one place for a long time, place one foot up on a stable object that is 2-4 inches (5-10 cm) high, such as a footstool. This  helps keep your spine neutral. Sitting   When sitting, keep your spine neutral and keep your feet flat on the floor. Use a footrest, if necessary, and keep your thighs parallel to the floor. Avoid rounding your shoulders, and avoid tilting your head forward.  When working at a desk or a computer, keep your desk at a height where your hands are slightly lower than your elbows. Slide your chair under your desk so you are close enough to maintain good posture.  When working at a computer, place your monitor at a height where you are looking straight ahead and you do not have to tilt your head forward or downward to look at the screen. Resting  When lying down and resting, avoid positions that are most painful for you.  If you have pain with activities such as sitting, bending, stooping, or squatting (flexion-based activities), lie in a position in which your body does not bend very much. For example, avoid curling up on your side with your arms and knees near your chest (fetal position).  If you have pain with activities such as standing for a long time or reaching with  your arms (extension-based activities), lie with your spine in a neutral position and bend your knees slightly. Try the following positions:  Lying on your side with a pillow between your knees.  Lying on your back with a pillow under your knees.  Lifting   When lifting objects, keep your feet at least shoulder-width apart and tighten your abdominal muscles.  Bend your knees and hips and keep your spine neutral. It is important to lift using the strength of your legs, not your back. Do not lock your knees straight out.  Always ask for help to lift heavy or awkward objects. This information is not intended to replace advice given to you by your health care provider. Make sure you discuss any questions you have with your health care provider. Document Released: 05/28/2005 Document Revised: 02/02/2016 Document Reviewed: 03/09/2015 Elsevier Interactive Patient Education  [employer].

## 2018-01-28 NOTE — Progress Notes (Signed)
Marc Tate  MRN: [id number] DOB: 02/24/1971  PCP: Patient, No Pcp Per  Chief Complaint  Patient presents with  . Flank Pain    follow up     Subjective:  Pt presents to clinic for follow-up of left flank/back pain.  Seems to be a feeling of tense tight.  Worse after sexual activity.  He never took Flexeril as he was scared of this because it makes him too sleepy.  He does note that the more activity he does the worse his left mid back hurts.  When he is not as active he has no pain.  5 days ago he stopped his Flomax and since then he has had increased testicular discomfort and abdominal pressure.  He does find that when this is present his back pain is more aggravated after sexual activity.  He is moving out of [place] next month and would like to get some of this wrapped up.  Upon review of his chart it looks like the renal US was never done.  He is having no urinary symptoms and no back pain with urination.  He has noticed no blood in his urine.  History is obtained by patient through interpretor at [facility] [place] [phone].  Review of Systems  Genitourinary: Positive for frequency (Better on Flomax), testicular pain (Better on Flomax) and urgency (Better on Flomax).  Musculoskeletal: Positive for back pain.    Patient Active Problem List   Diagnosis Date Noted  . History of kidney stones 12/17/2017    Current Outpatient Medications on File Prior to Visit  Medication Sig Dispense Refill  . docusate sodium (COLACE) 100 MG capsule Take 1 capsule (100 mg total) by mouth 2 (two) times daily. 10 capsule 0  . ibuprofen (ADVIL,MOTRIN) 200 MG tablet Take 200 mg by mouth every 6 (six) hours as needed.    . meloxicam (MOBIC) 7.5 MG tablet Take 1-2 tablets (7.5-15 mg total) by mouth daily. 30 tablet 0   No current facility-administered medications on file prior to visit.     No Known Allergies  Past Medical History:  Diagnosis Date  . Chronic kidney disease   . Depression    . GERD (gastroesophageal reflux disease)   . Left ureteral stone    Social History   Social History Narrative   ** Merged History Encounter **       Social History   Tobacco Use  . Smoking status: Never Smoker  . Smokeless tobacco: Never Used  Substance Use Topics  . Alcohol use: No  . Drug use: No   family history includes Cancer in his mother.     Objective:  BP 132/70   Pulse 83   Temp 99.5 F (37.5 C) (Oral)   Resp 18   Ht 5' 7.32" (1.71 m)   Wt 170 lb (77.1 kg)   SpO2 98%   BMI 26.37 kg/m  Body mass index is 26.37 kg/m.  Wt Readings from Last 3 Encounters:  01/28/18 170 lb (77.1 kg)  12/17/17 178 lb 12.8 oz (81.1 kg)  12/03/17 178 lb (80.7 kg)    Physical Exam  Constitutional: He is oriented to person, place, and time. He appears well-developed and well-nourished.  HENT:  Head: Normocephalic and atraumatic.  Right Ear: External ear normal.  Left Ear: External ear normal.  Eyes: Conjunctivae are normal.  Neck: Normal range of motion.  Pulmonary/Chest: Effort normal.  Musculoskeletal:       Back:  Neurological: He is alert and  oriented to person, place, and time.  Skin: Skin is warm and dry.  Psychiatric: Judgment normal.  Vitals reviewed.   Assessment and Plan :  Muscle spasm - Plan: metaxalone (SKELAXIN) 800 MG tablet  Left flank pain - Plan: tamsulosin (FLOMAX) 0.4 MG CAPS capsule, metaxalone (SKELAXIN) 800 MG tablet  History of kidney stones/urinary urgency - Plan: tamsulosin (FLOMAX) 0.4 MG CAPS capsule -patient symptoms are better while on Flomax patient should continue.  Since [place] if this should continue or worsen he should follow-up with urologist in the new city in which he moves to.  Strain of mid-back, subsequent encounter - Plan: Ambulatory referral to Physical Therapy -suspect musculoskeletal cause of the back pain will do a trial of physical therapy.  He will try Skelaxin though he is concerned about  muscle relaxers this does have less sedating qualities.  Patient verbalized to me that they understand the following: diagnosis, what is being done for them, what to expect and what should be done at home.  Their questions have been answered.  See after visit summary for patient specific instructions.  Benny LennertSarah Nuha Degner PA-C  Primary Care at [facility] 01/28/2018 8:57 AM  Please note: Portions of this report may have been transcribed using dragon voice recognition software. Every effort was made to ensure accuracy; however, inadvertent computerized transcription errors may be present.

## 2018-02-12 ENCOUNTER — Encounter: Payer: Self-pay | Admitting: Physical Therapy

## 2018-02-12 ENCOUNTER — Ambulatory Visit: Payer: BLUE CROSS/BLUE SHIELD | Attending: Physician Assistant | Admitting: Physical Therapy

## 2018-02-12 DIAGNOSIS — S29012D Strain of muscle and tendon of back wall of thorax, subsequent encounter: Secondary | ICD-10-CM | POA: Insufficient documentation

## 2018-02-12 NOTE — Therapy (Signed)
[facility] [address] [place], [place], [address] Phone: [phone]   Fax:  [phone]  Physical Therapy Evaluation  Patient Details  Name: Judge Decaro MRN: [id number] Date of Birth: 03/31/1971 Referring Provider: Benny Lennert PA-C   Encounter Date: 02/12/2018  PT End of Session - 02/12/18 0905    Visit Number  1    Number of Visits  8    Date for PT Re-Evaluation  03/26/18    PT Start Time  0805    PT Stop Time  0850    PT Time Calculation (min)  45 min    Activity Tolerance  Patient tolerated treatment well    Behavior During Therapy  [facility] for tasks assessed/performed       Past Medical History:  Diagnosis Date  . Chronic kidney disease   . Depression   . GERD (gastroesophageal reflux disease)   . Left ureteral stone     Past Surgical History:  Procedure Laterality Date  . CYSTOSCOPY WITH RETROGRADE PYELOGRAM, URETEROSCOPY AND STENT PLACEMENT Left 12/06/2016   Procedure: CYSTOSCOPY WITH RETROGRADE PYELOGRAM, URETEROSCOPY, STONE EXTRACTION;  Surgeon: Marcine Matar, MD;  Location: [facility];  Service: Urology;  Laterality: Left;  . HEMORRHOID SURGERY  2008 approx.    There were no vitals filed for this visit.   Subjective Assessment - 02/12/18 0851    Subjective  Pt relays Lt sided mid-low back pain for last 3 years, he thought this was coming from kidneys as he has chronic kidney disease and kidney stones. He relays pain is worse with sexual intercourse. He denies any radiculopathy. MD has now referred him to PT for lumbar strain.     Pertinent History  Chronic kidney disease,Depression, GERD, Left ureteral stone    Limitations  Lifting    Patient Stated Goals  get rid of pain    Currently in Pain?  Yes    Pain Score  7     Pain Location  Back    Pain Orientation  Left    Pain Descriptors / Indicators  Aching;Tightness    Pain Type  Chronic pain    Pain Radiating Towards  none    Pain  Onset  More than a month ago    Pain Frequency  Intermittent    Aggravating Factors   sexual intercourse, lifting or carrying    Pain Relieving Factors  rest, meds         [facility] PT Assessment - 02/12/18 0001      Assessment   Medical Diagnosis  mid-LBP/strain    Referring Provider  Benny Lennert PA-C    Next MD Visit  not scheduled    Prior Therapy  none      Precautions   Precautions  None      Balance Screen   Has the patient fallen in the past 6 months  No      Prior Function   Level of Independence  Independent    Vocation  Full time employment    Vocation Requirements  packaging, stading 8 hours with occasional lifting      Cognition   Overall Cognitive Status  Within Functional Limits for tasks assessed      Observation/Other Assessments   Focus on Therapeutic Outcomes (FOTO)   43%limited      Sensation   Light Touch  Appears Intact      ROM / Strength   AROM / PROM / Strength  AROM;Strength  AROM   Overall AROM Comments  pain with ext, Lt SB    AROM Assessment Site  Lumbar    Lumbar Flexion  90%    Lumbar Extension  75%    Lumbar - Right Side Bend  90%    Lumbar - Left Side Bend  75%    Lumbar - Right Rotation  75%    Lumbar - Left Rotation  75%      Strength   Overall Strength Comments  LE WNL except Lt hip overall 4+/5 grossly      Flexibility   Soft Tissue Assessment /Muscle Length  --   tight lumbar P.S, and H.S     Palpation   Palpation comment  TTP lt lumbar P.S L1-L4      Special Tests   Other special tests  neg SLR, positive FABERS      Ambulation/Gait   Gait Comments  WFL                Objective measurements completed on examination: See above findings.      [facility] Adult PT Treatment/Exercise - 02/12/18 0001      Modalities   Modalities  Electrical Stimulation;Moist Heat      Moist Heat Therapy   Number Minutes Moist Heat  10 Minutes    Moist Heat Location  Lumbar Spine      Electrical Stimulation   Electrical  Stimulation Location  lumbar    Electrical Stimulation Action  IFC    Electrical Stimulation Parameters  tolerance    Electrical Stimulation Goals  Tone;Pain             PT Education - 02/12/18 1610    Education Details  HEP, POC, TENS    Person(s) Educated  Patient    Methods  Explanation;Demonstration;Verbal cues;Handout    Comprehension  Verbalized understanding;Need further instruction          PT Long Term Goals - 02/12/18 0909      PT LONG TERM GOAL #1   Title  Pt will be I and compliant with HEP. 6 weeks 03/26/18    Status  New      PT LONG TERM GOAL #2   Title  Pt will improve lumbar ROM to [facility].  6 weeks 03/26/18    Status  New      PT LONG TERM GOAL #3   Title  Pt will decrease pain to overall less than 3/10 with usual activity and work activity.  6 weeks 03/26/18             Plan - 02/12/18 0905    Clinical Impression Statement  Pt presents with Lt sided mid-low back pain and strain. PT cant rule out that this is coming from kidney however as he as chronic kidney disease, however he does have increased tightness in Lt lumbar paraspinals, and presents as strain. He has slightly decreased Lt hip strength, ROM deficits, decreased neuromuscular control, and incrased pain that limit his full function. He will benefit from skilled PT to address these deficits.     Clinical Presentation  Stable    Clinical Decision Making  Low    Rehab Potential  Good    PT Frequency  Other (comment)   1-2   PT Duration  6 weeks    PT Treatment/Interventions  Cryotherapy;Electrical Stimulation;Iontophoresis 4mg /ml Dexamethasone;Ultrasound;Therapeutic activities;Therapeutic exercise;Neuromuscular re-education;Manual techniques;Dry needling    PT Next Visit Plan  review HEP, MT and modalties PRN, lumbar stretching and core  Consulted and Agree with Plan of Care  Patient       Patient will benefit from skilled therapeutic intervention in order to improve the following  deficits and impairments:  Decreased activity tolerance, Decreased range of motion, Decreased strength, Impaired flexibility, Pain  Visit Diagnosis: Strain of mid-back, subsequent encounter     Problem List Patient Active Problem List   Diagnosis Date Noted  . History of kidney stones 12/17/2017    Darra Lis, DPT 02/12/2018, 9:12 AM  [facility] [address] [place], [place], [address] Phone: [phone]   Fax:  [phone]  Name: Ahren Pettinger MRN: [id number] Date of Birth: 1970/12/30

## 2018-02-12 NOTE — Patient Instructions (Addendum)

## 2018-02-20 ENCOUNTER — Ambulatory Visit: Payer: BLUE CROSS/BLUE SHIELD | Admitting: Physical Therapy

## 2018-02-20 DIAGNOSIS — S29012D Strain of muscle and tendon of back wall of thorax, subsequent encounter: Secondary | ICD-10-CM

## 2018-02-20 NOTE — Therapy (Signed)
[facility] [address] [place], [place], [address] Phone: [phone]   Fax:  [phone]  Physical Therapy Treatment  Patient Details  Name: Marc ClementSaeed Schaumburg MRN: [id number] Date of Birth: 10/25/1970 Referring Provider: Benny LennertSarah Weber PA-C   Encounter Date: 02/20/2018  PT End of Session - 02/20/18 0850    Visit Number  2    Number of Visits  8    Date for PT Re-Evaluation  03/26/18    PT Start Time  0805    PT Stop Time  0858    PT Time Calculation (min)  53 min    Activity Tolerance  Patient tolerated treatment well    Behavior During Therapy  [facility] for tasks assessed/performed       Past Medical History:  Diagnosis Date  . Chronic kidney disease   . Depression   . GERD (gastroesophageal reflux disease)   . Left ureteral stone     Past Surgical History:  Procedure Laterality Date  . CYSTOSCOPY WITH RETROGRADE PYELOGRAM, URETEROSCOPY AND STENT PLACEMENT Left 12/06/2016   Procedure: CYSTOSCOPY WITH RETROGRADE PYELOGRAM, URETEROSCOPY, STONE EXTRACTION;  Surgeon: Marcine Matarahlstedt, Stephen, MD;  [facility];  Service: Urology;  Laterality: Left;  . HEMORRHOID SURGERY  2008 approx.    There were no vitals filed for this visit.  Subjective Assessment - 02/20/18 0821    Subjective  Pt relays he did HEP for 2 days but then has stopped, he relays his back is no better but no worse. He was encouraged to give his HEP a chance and perform daily     Pain Score  7     Pain Location  Back    Pain Orientation  Left    Pain Descriptors / Indicators  Aching;Tightness                       [facility] Adult PT Treatment/Exercise - 02/20/18 0001      Exercises   Exercises  Lumbar      Lumbar Exercises: Stretches   Passive Hamstring Stretch  Right;Left;2 reps;30 seconds    Single Knee to Chest Stretch  Right;Left;2 reps;30 seconds    Lower Trunk Rotation  3 reps;10 seconds    Piriformis Stretch  2 reps;30  seconds;Right;Left    Other Lumbar Stretch Exercise  child pose and child pose leaning Rt to stretch Lt 30 sec X 2 ea      Lumbar Exercises: Supine   Bent Knee Raise  10 reps   with TAC     Modalities   Modalities  Electrical Stimulation;Moist Heat      Moist Heat Therapy   Number Minutes Moist Heat  15 Minutes    Moist Heat Location  Lumbar Spine      Electrical Stimulation   Electrical Stimulation Location  lumbar    Electrical Stimulation Action  IFC    Electrical Stimulation Parameters  15    Electrical Stimulation Goals  Tone;Pain      Manual Therapy   Manual therapy comments  STM, unilat PA mobs, and rib spinging Lt lumbar-thoracic             PT Education - 02/20/18 0850    Education Details  HEP review     Person(s) Educated  Patient    Methods  Explanation;Demonstration;Tactile cues;Verbal cues    Comprehension  Verbalized understanding;Returned demonstration          PT Long Term Goals - 02/20/18 [id number]  PT LONG TERM GOAL #1   Title  Pt will be I and compliant with HEP. 6 weeks 03/26/18    Status  On-going      PT LONG TERM GOAL #2   Title  Pt will improve lumbar ROM to [facility].  6 weeks 03/26/18    Status  On-going      PT LONG TERM GOAL #3   Title  Pt will decrease pain to overall less than 3/10 with usual activity and work activity.  6 weeks 03/26/18    Status  On-going            Plan - 02/20/18 0851    Clinical Impression Statement  Session focused on reducing tightness and spams in Left lumbar with ROM, stretching, and MT. Session then ended with MHP and E-stim to further reduce pain and spasm. He needed cuing for proper technique of HEP but then he was able to return demonstration. He was again encouraged to perform his stretching program at home to reduce tightness    Rehab Potential  Good    PT Frequency  Other (comment)   1-2   PT Duration  6 weeks    PT Treatment/Interventions  Cryotherapy;Electrical Stimulation;Iontophoresis  4mg /ml Dexamethasone;Ultrasound;Therapeutic activities;Therapeutic exercise;Neuromuscular re-education;Manual techniques;Dry needling    PT Next Visit Plan  MT and modalties PRN (may trial U.S or KT tape), lumbar stretching and core    Consulted and Agree with Plan of Care  Patient       Patient will benefit from skilled therapeutic intervention in order to improve the following deficits and impairments:  Decreased activity tolerance, Decreased range of motion, Decreased strength, Impaired flexibility, Pain  Visit Diagnosis: Strain of mid-back, subsequent encounter     Problem List Patient Active Problem List   Diagnosis Date Noted  . History of kidney stones 12/17/2017    April Manson, PT, DPT 02/20/2018, 8:54 AM  [facility] [address] [place], [place], [address] Phone: [phone]   Fax:  [phone]  Name: Marc Tate MRN: [id number] Date of Birth: May 03, 1971

## 2018-02-24 ENCOUNTER — Ambulatory Visit: Payer: BLUE CROSS/BLUE SHIELD | Admitting: Physical Therapy

## 2018-02-26 ENCOUNTER — Encounter: Payer: Self-pay | Admitting: Physical Therapy

## 2018-02-26 ENCOUNTER — Ambulatory Visit: Payer: BLUE CROSS/BLUE SHIELD | Admitting: Physical Therapy

## 2018-02-26 DIAGNOSIS — S29012D Strain of muscle and tendon of back wall of thorax, subsequent encounter: Secondary | ICD-10-CM

## 2018-02-26 NOTE — Therapy (Signed)
[facility] [address] [place], [place], [address] Phone: [phone]   Fax:  [phone]  Physical Therapy Treatment  Patient Details  Name: Kylee Nardozzi MRN: [id number] Date of Birth: 08/10/1970 Referring Provider: Benny Lennert PA-C   Encounter Date: 02/26/2018  PT End of Session - 02/26/18 0947    Visit Number  3    Number of Visits  8    Date for PT Re-Evaluation  03/26/18    PT Start Time  0848    PT Stop Time  0930    PT Time Calculation (min)  42 min    Activity Tolerance  Patient tolerated treatment well    Behavior During Therapy  [facility] for tasks assessed/performed       Past Medical History:  Diagnosis Date  . Chronic kidney disease   . Depression   . GERD (gastroesophageal reflux disease)   . Left ureteral stone     Past Surgical History:  Procedure Laterality Date  . CYSTOSCOPY WITH RETROGRADE PYELOGRAM, URETEROSCOPY AND STENT PLACEMENT Left 12/06/2016   Procedure: CYSTOSCOPY WITH RETROGRADE PYELOGRAM, URETEROSCOPY, STONE EXTRACTION;  Surgeon: Marcine Matar, MD;  Location: [facility];  Service: Urology;  Laterality: Left;  . HEMORRHOID SURGERY  2008 approx.    There were no vitals filed for this visit.  Subjective Assessment - 02/26/18 0853    Subjective  He is doing the exercises regularly. Pain 5/10.   does not have the time or the energy to do.     Patient is accompained by:  Interpreter    Currently in Pain?  Yes    Pain Score  5     Pain Location  Back    Pain Orientation  Left    Pain Descriptors / Indicators  Aching;Tightness    Pain Type  Chronic pain    Pain Frequency  Intermittent    Aggravating Factors   standing too long,  holding things for several hours    Pain Relieving Factors  resting  medication   shower after work    Multiple Pain Sites  --   Lt shoulder,( up to 3/10)  knees (3/10) long standing                       [facility] Adult PT  Treatment/Exercise - 02/26/18 0001      Self-Care   Self-Care  Other Self-Care Comments    Other Self-Care Comments   Anatomy,  how QL and tight hip flexors can affect back,      Lumbar Exercises: Stretches   Hip Flexor Stretch  2 reps;20 seconds;Left;Right    Hip Flexor Stretch Limitations  tighter right,  HEP    Other Lumbar Stretch Exercise  Mc kenzie lateral techniques standing and on side left      Modalities   Modalities  Ultrasound      Ultrasound   Ultrasound Location  right low back QL    Ultrasound Parameters  1.5 watts/cm2,  100% X 8 minutes while on side stretching over rolled sheet    Ultrasound Goals  Pain      Manual Therapy   Manual therapy comments  PROM QL right             PT Education - 02/26/18 0941    Education Details  HEP,  self care    Person(s) Educated  Patient    Methods  Explanation;Demonstration;Handout;Tactile cues    Comprehension  Verbalized understanding;Returned demonstration  PT Long Term Goals - 02/26/18 1324      PT LONG TERM GOAL #1   Title  Pt will be I and compliant with HEP. 6 weeks 03/26/18    Baseline  non compliant,  does some of the sometimes.    Period  Weeks    Status  On-going      PT LONG TERM GOAL #2   Title  [facility].  6 weeks 03/26/18    Period  Weeks    Status  Unable to assess      PT LONG TERM GOAL #3   Title  Pt will decrease pain to overall less than 3/10 with usual activity and work activity.  6 weeks 03/26/18    Baseline  6/10    Period  Weeks    Status  On-going            Plan - 02/26/18 1320    Clinical Impression Statement  Patient was able to decrease pain with exercise and modalities. Lateral shift was decreased post session. Progress toward HEP goal with the addition of new exercises.    PT Next Visit Plan  MT and modalties PRN   US if helpful (may trial KT tape), lumbar stretching and core    PT Home Exercise Plan  McKenzie lateral shift standing  and on side,  hip flexor stretch.    Consulted and Agree with Plan of Care  Patient       Patient will benefit from skilled therapeutic intervention in order to improve the following deficits and impairments:     Visit Diagnosis: Strain of mid-back, subsequent encounter     Problem List Patient Active Problem List   Diagnosis Date Noted  . History of kidney stones 12/17/2017    HARRIS,KAREN PTA 02/26/2018, 1:27 PM  [facility] [address] [place], [place], [address] Phone: [phone]   Fax:  [phone]  Name: Gordy ClementSaeed Bean MRN: [id number] Date of Birth: 02/22/1971

## 2018-02-26 NOTE — Patient Instructions (Addendum)
HIP: Flexors - Supine    Lie on edge of surface. Place leg off the surface, allow knee to bend. Bring other knee toward chest. Hold _1- 30__ seconds. _3__ reps per set, _1__ sets per day, _7__ days per week   Copyright  VHI. All rights reserved.  aLSO ISSUED FROM EXERCISE DRAWER McKenzie lateral technique Standing and on side PRN Hold 10- 30 seconds 3 to 5 X  Interperter added aerobic cues

## 2018-03-06 ENCOUNTER — Ambulatory Visit: Payer: BLUE CROSS/BLUE SHIELD | Admitting: Physical Therapy

## 2018-03-06 DIAGNOSIS — S29012D Strain of muscle and tendon of back wall of thorax, subsequent encounter: Secondary | ICD-10-CM

## 2018-03-06 NOTE — Therapy (Addendum)
[place], [place], [address] Phone: [phone]   Fax:  [phone]  Physical Therapy Treatment/Discharge Addendum  Patient Details  Name: Marc Tate MRN: [id number] Date of Birth: [birth year] Referring Provider: Windell Hummingbird PA-C   Encounter Date: 03/06/2018  PT End of Session - 03/06/18 1027    Visit Number  4    Number of Visits  8    Date for PT Re-Evaluation  03/26/18    PT Start Time  0935   pt 5 min late   PT Stop Time  1016    PT Time Calculation (min)  41 min    Activity Tolerance  Patient tolerated treatment well    Behavior During Therapy  [facility] for tasks assessed/performed       Past Medical History:  Diagnosis Date  . Chronic kidney disease   . Depression   . GERD (gastroesophageal reflux disease)   . Left ureteral stone     Past Surgical History:  Procedure Laterality Date  . CYSTOSCOPY WITH RETROGRADE PYELOGRAM, URETEROSCOPY AND STENT PLACEMENT Left 12/06/2016   Procedure: CYSTOSCOPY WITH RETROGRADE PYELOGRAM, URETEROSCOPY, STONE EXTRACTION;  Surgeon: Franchot Gallo, MD;  [facility]  Service: Urology;  Laterality: Left;  . HEMORRHOID SURGERY  2008 approx.    There were no vitals filed for this visit.  Subjective Assessment - 03/06/18 1026    Subjective  Pt relays no pain at rest now however he has had a few days off work. He relays the pain can get up to 7/10 with working    Currently in Pain?  No/denies         [facility] PT Assessment - 03/06/18 0001      Assessment   Medical Diagnosis  mid-LBP/strain    Referring Provider  Windell Hummingbird PA-C    Next MD Visit  not scheduled      ROM / Strength   AROM / PROM / Strength  AROM;Strength      AROM   Overall AROM Comments  pain with ext, Lt SB    AROM Assessment Site  Lumbar    Lumbar Flexion  WNL    Lumbar Extension  90%    Lumbar - Right Side Bend  WNL    Lumbar - Left Side Bend  WNL    Lumbar -  Right Rotation  WNL    Lumbar - Left Rotation  WNL      Strength   Overall Strength Comments  LE strength now WNL bilat                   [facility] Adult PT Treatment/Exercise - 03/06/18 0001      Exercises   Exercises  Lumbar      Lumbar Exercises: Stretches   Single Knee to Chest Stretch  Right;Left;2 reps;30 seconds    Hip Flexor Stretch  2 reps;20 seconds;Left;Right    Piriformis Stretch  2 reps;30 seconds;Right;Left      Lumbar Exercises: Standing   Row  Strengthening;20 reps    Theraband Level (Row)  Level 3 (Green)    Shoulder Extension  Strengthening    Theraband Level (Shoulder Extension)  Level 3 (Green)      Lumbar Exercises: Supine   Bridge  15 reps;5 seconds    Straight Leg Raise  15 reps   bilat     Lumbar Exercises: Sidelying   Hip Abduction  Both;15 reps      Modalities  Modalities  Ultrasound      Ultrasound   Ultrasound Location  bilat lumbar P.S 5 min each side    Ultrasound Parameters  1.5 w/cm2, 100%, 1.0 mhz    Ultrasound Goals  Pain      Manual Therapy   Manual therapy comments  KT tape applied in sitting lumbar flexion to bilat P.S 2 vertical I strips and one horizontal             PT Education - 03/06/18 1027    Education Details  KT tape    Methods  Explanation;Verbal cues    Comprehension  Verbalized understanding          PT Long Term Goals - 03/06/18 1028      PT LONG TERM GOAL #1   Title  Pt will be I and compliant with HEP. 6 weeks 03/26/18    Status  On-going      PT LONG TERM GOAL #2   Title  Pt will improve lumbar ROM to [facility].  6 weeks 03/26/18    Status  Achieved      PT LONG TERM GOAL #3   Title  Pt will decrease pain to overall less than 3/10 with usual activity and work activity.  6 weeks 03/26/18    Status  On-going            Plan - 03/06/18 1029    Clinical Impression Statement  Pt reported U.S helped from last session to this was continued with extra time. He has made good progress thus  far and has made improvments in strength and ROM, see updated measurements. He has also now met LTG #2. He was trialed with KT tape today in efforts to reduce lumbar strain during work.     PT Frequency  Other (comment)    PT Duration  6 weeks    PT Treatment/Interventions  Cryotherapy;Electrical Stimulation;Iontophoresis 22m/ml Dexamethasone;Ultrasound;Therapeutic activities;Therapeutic exercise;Neuromuscular re-education;Manual techniques;Dry needling    PT Next Visit Plan  Asssess response to KT tape, continue [place] he says it is still helping, lumbar stretching and core    Consulted and Agree with Plan of Care  Patient       Patient will benefit from skilled therapeutic intervention in order to improve the following deficits and impairments:  Decreased activity tolerance, Decreased range of motion, Decreased strength, Impaired flexibility, Pain  Visit Diagnosis: Strain of mid-back, subsequent encounter     Problem List Patient Active Problem List   Diagnosis Date Noted  . History of kidney stones 12/17/2017    BDebbe Odea PT, DPT 03/06/2018, 10:32 AM  [facility] [address] [address]Phone: [phone]  Fax:  [phone] Name: SHanson MedeirosMRN: [id number] of Birth: 1June 11, 1972 PHYSICAL THERAPY DISCHARGE SUMMARY  Visits from Start of Care: 4  Current functional level related to goals / functional outcomes: See above   Remaining deficits: See above   Education / Equipment: HEP Plan: Patient agrees to discharge.  Patient goals were not met. Patient is being discharged due to not returning since the last visit.  ?????   BElsie Ra PT, DPT 04/28/18 3:30 PM

## 2018-03-10 ENCOUNTER — Encounter: Payer: BLUE CROSS/BLUE SHIELD | Admitting: Physical Therapy

## 2018-03-12 ENCOUNTER — Encounter: Payer: BLUE CROSS/BLUE SHIELD | Admitting: Physical Therapy

## 2018-03-20 ENCOUNTER — Encounter: Payer: BLUE CROSS/BLUE SHIELD | Admitting: Physical Therapy

## 2018-03-24 ENCOUNTER — Encounter: Payer: BLUE CROSS/BLUE SHIELD | Admitting: Physical Therapy

## 2018-03-26 ENCOUNTER — Encounter: Payer: BLUE CROSS/BLUE SHIELD | Admitting: Physical Therapy

## 2018-04-03 ENCOUNTER — Encounter: Payer: BLUE CROSS/BLUE SHIELD | Admitting: Physical Therapy

## 2018-04-07 ENCOUNTER — Encounter: Payer: BLUE CROSS/BLUE SHIELD | Admitting: Physical Therapy

## 2018-04-09 ENCOUNTER — Encounter: Payer: BLUE CROSS/BLUE SHIELD | Admitting: Physical Therapy

## 2018-04-15 IMAGING — CT CT RENAL STONE PROTOCOL
2 of 4 series · 16 of 46 positions shown, 18 images · non-contrast
Comparison: None.

CLINICAL DATA: Flank pain. Nephrolithiasis. Urinary hesitancy.
Abdomen pain greater than one week, minimal improvement r/o
obstructive nephropathy.

EXAM:
CT ABDOMEN AND PELVIS WITHOUT CONTRAST
TECHNIQUE: Multidetector CT imaging of the abdomen and pelvis was performed
following the standard protocol without IV contrast.

[Series 2: axial st · axial · 0.89mm/px · z∈[-482,-102]mm · 13 of 86 slices shown, 15 images]
[im 5/86  soft-tissue]
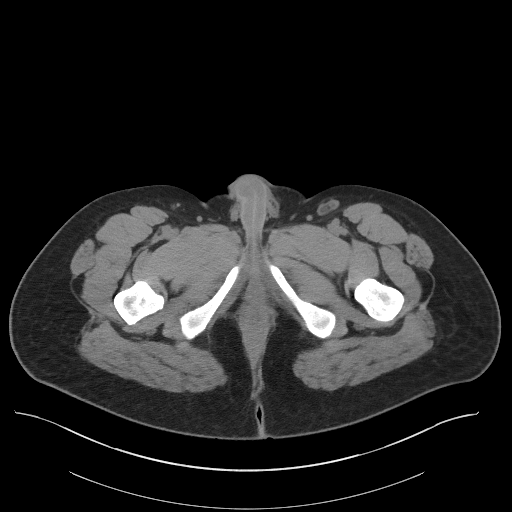
[im 5/86  bone]
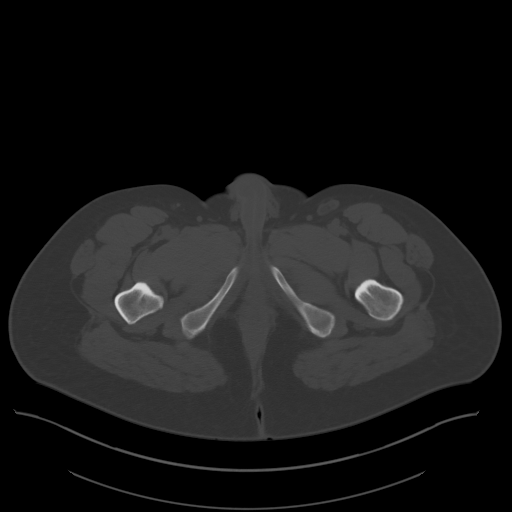
[im 13/86  soft-tissue]
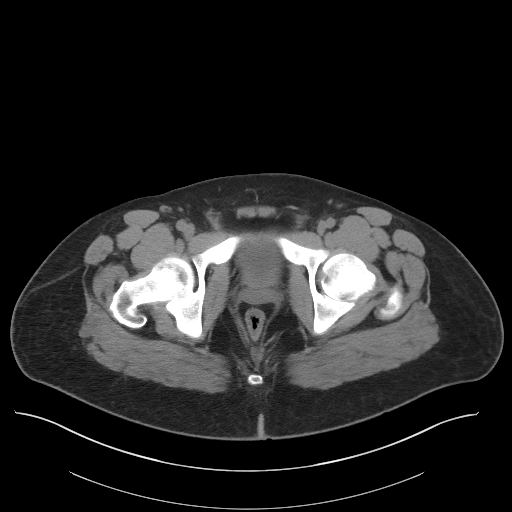
[im 18/86  soft-tissue]
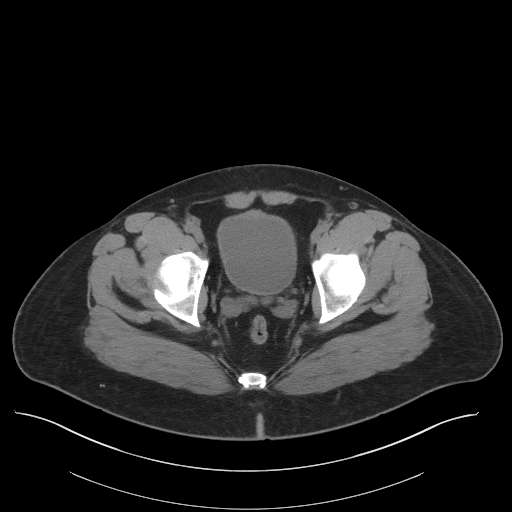
[im 26/86  soft-tissue]
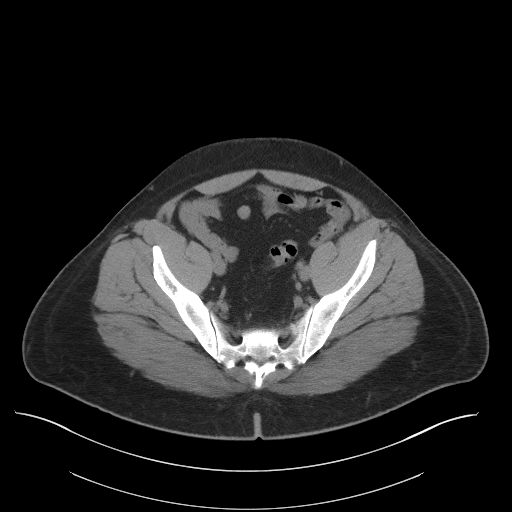
[im 30/86  soft-tissue]
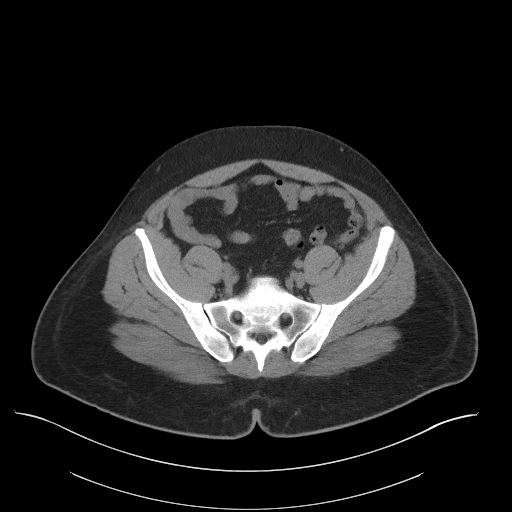
[im 39/86  soft-tissue]
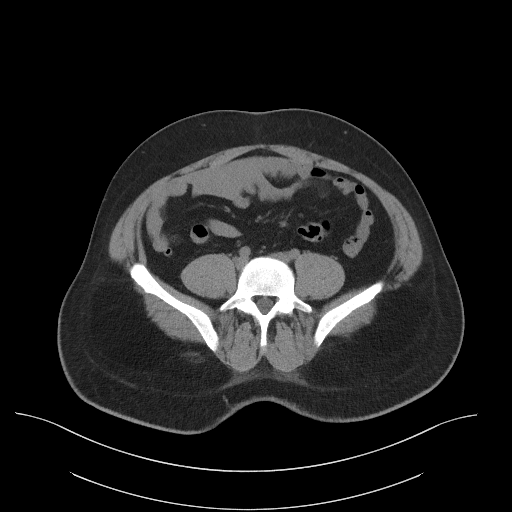
[im 43/86  soft-tissue]
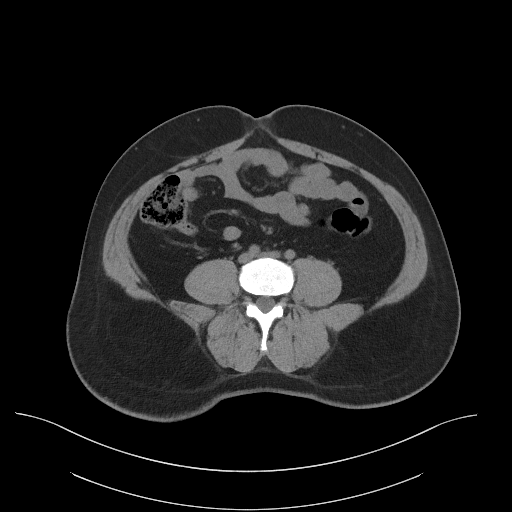
[im 47/86  soft-tissue]
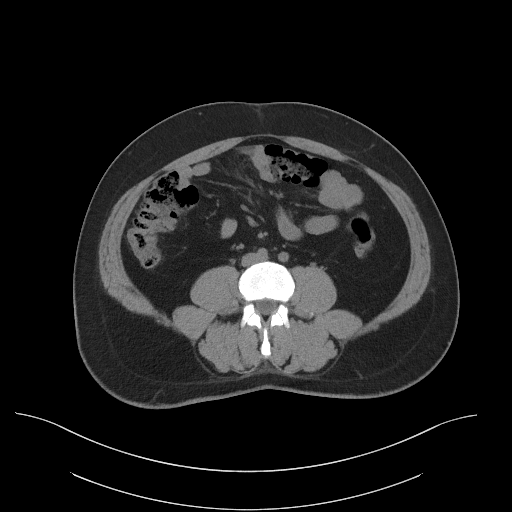
[im 56/86  soft-tissue]
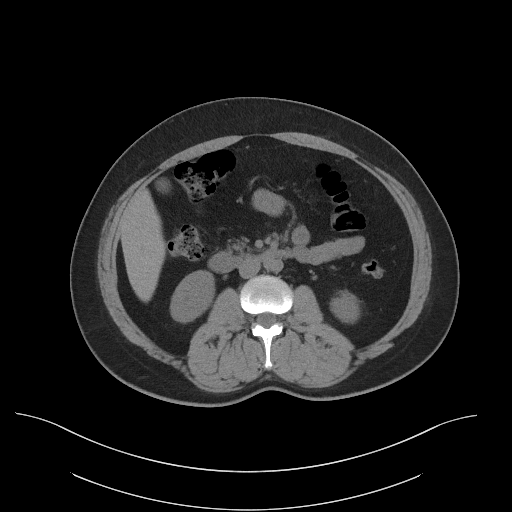
[im 56/86  bone]
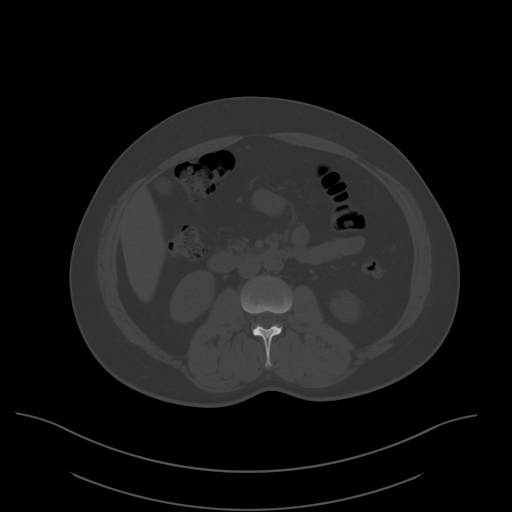
[im 60/86  soft-tissue]
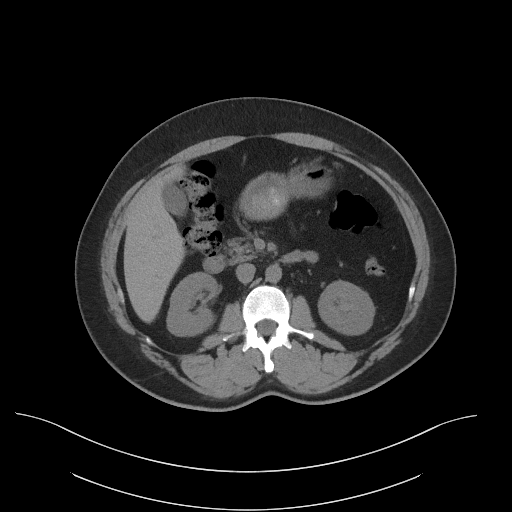
[im 69/86  soft-tissue]
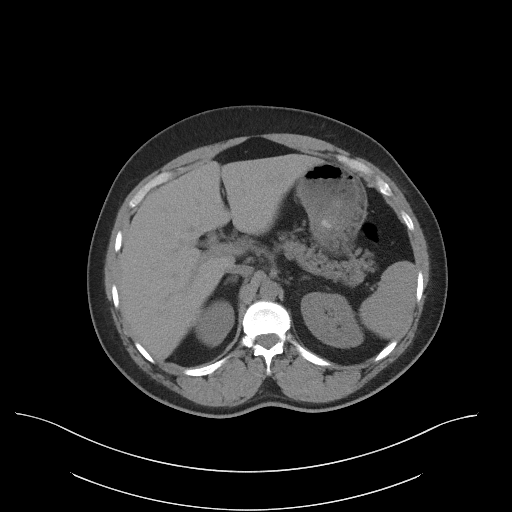
[im 73/86  soft-tissue]
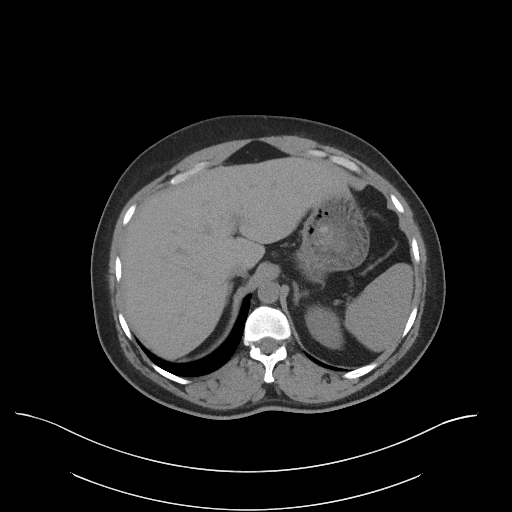
[im 81/86  soft-tissue]
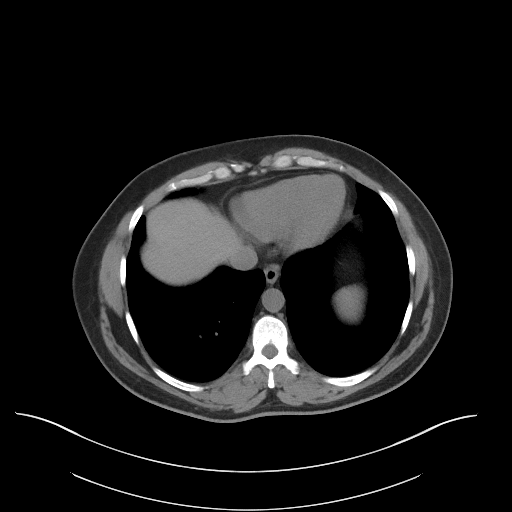

[Series 5: coronal · coronal · 0.64mm/px · 3 of 137 slices shown]
[im 46/137  soft-tissue]
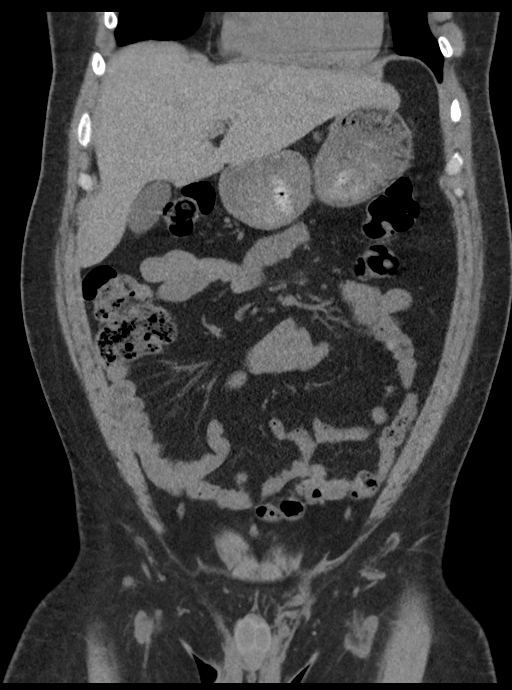
[im 61/137  soft-tissue]
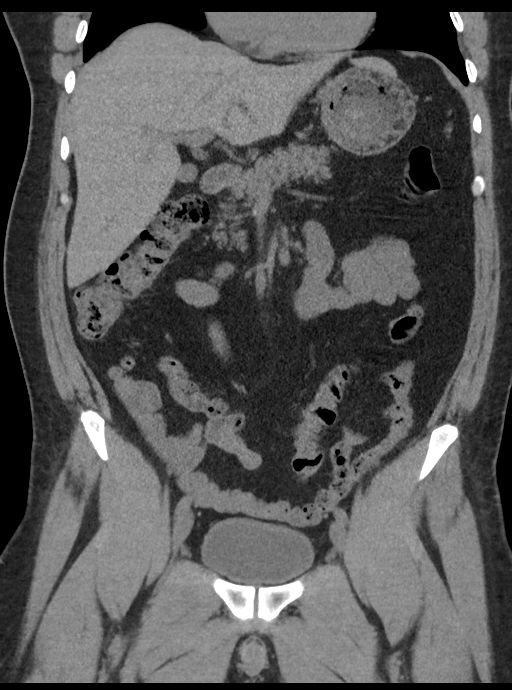
[im 76/137  soft-tissue]
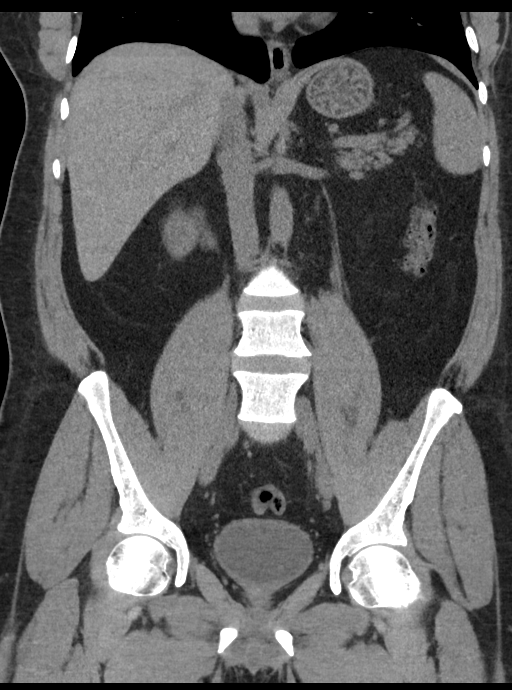

[16 of 46 positions shown; findings below may reference images not displayed]

FINDINGS: Lower chest: No acute abnormality.

Hepatobiliary: No focal liver abnormality is seen. No gallstones,
gallbladder wall thickening, or biliary dilatation.

Pancreas: Unremarkable. No pancreatic ductal dilatation or
surrounding inflammatory changes.

Spleen: Normal in size without focal abnormality.

Adrenals/Urinary Tract: 4 mm calculus just proximal to the left
ureteral orifice, with mild distal ureterectasis. No hydronephrosis.
No other urolithiasis. No renal lesion. Adrenals unremarkable.
Urinary bladder incompletely distended.

Stomach/Bowel: Stomach is within normal limits. Appendix appears
normal. No evidence of bowel wall thickening, distention, or
inflammatory changes.

Vascular/Lymphatic: No significant vascular findings are present. No
enlarged abdominal or pelvic lymph nodes. Left pelvic phlebolith.

Reproductive: Mild prostatic enlargement.

Other: No ascites.  No free air.

Musculoskeletal: No acute or significant osseous findings.
IMPRESSION: 1. Partially obstructing 4 mm distal left ureteral calculus.

## 2018-04-17 ENCOUNTER — Encounter: Payer: BLUE CROSS/BLUE SHIELD | Admitting: Physical Therapy

## 2018-07-03 IMAGING — CR DG ABDOMEN 1V
2 series · 2 of 2 positions shown · non-contrast
Comparison: CT Abdomen and Pelvis 09/18/2016. [REDACTED] KUB 11/28/2016.

CLINICAL DATA: 46-year-old male preoperative study for left
ureteral stone.

EXAM:
ABDOMEN - 1 VIEW

[t abdomen supine *]
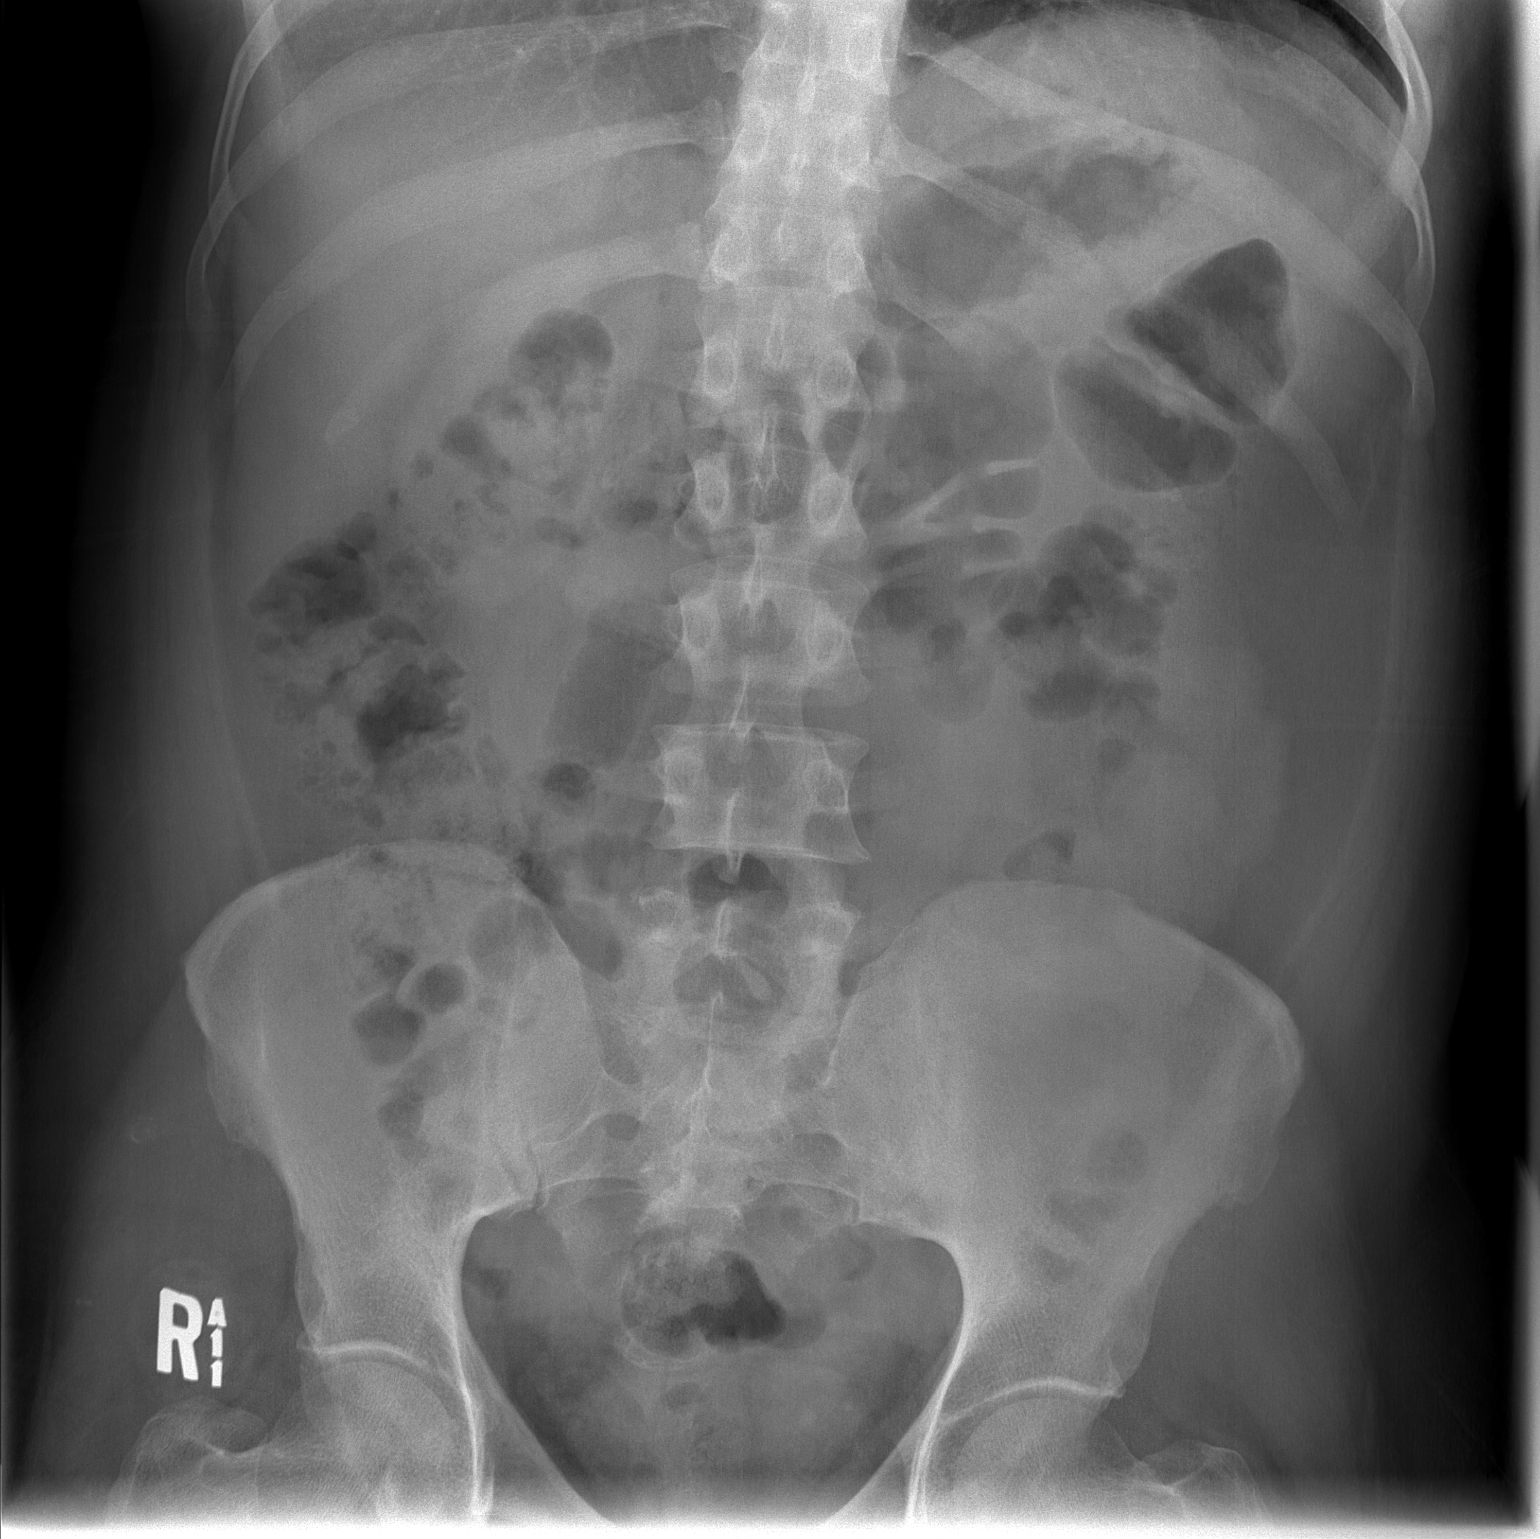

[t abdomen supine]
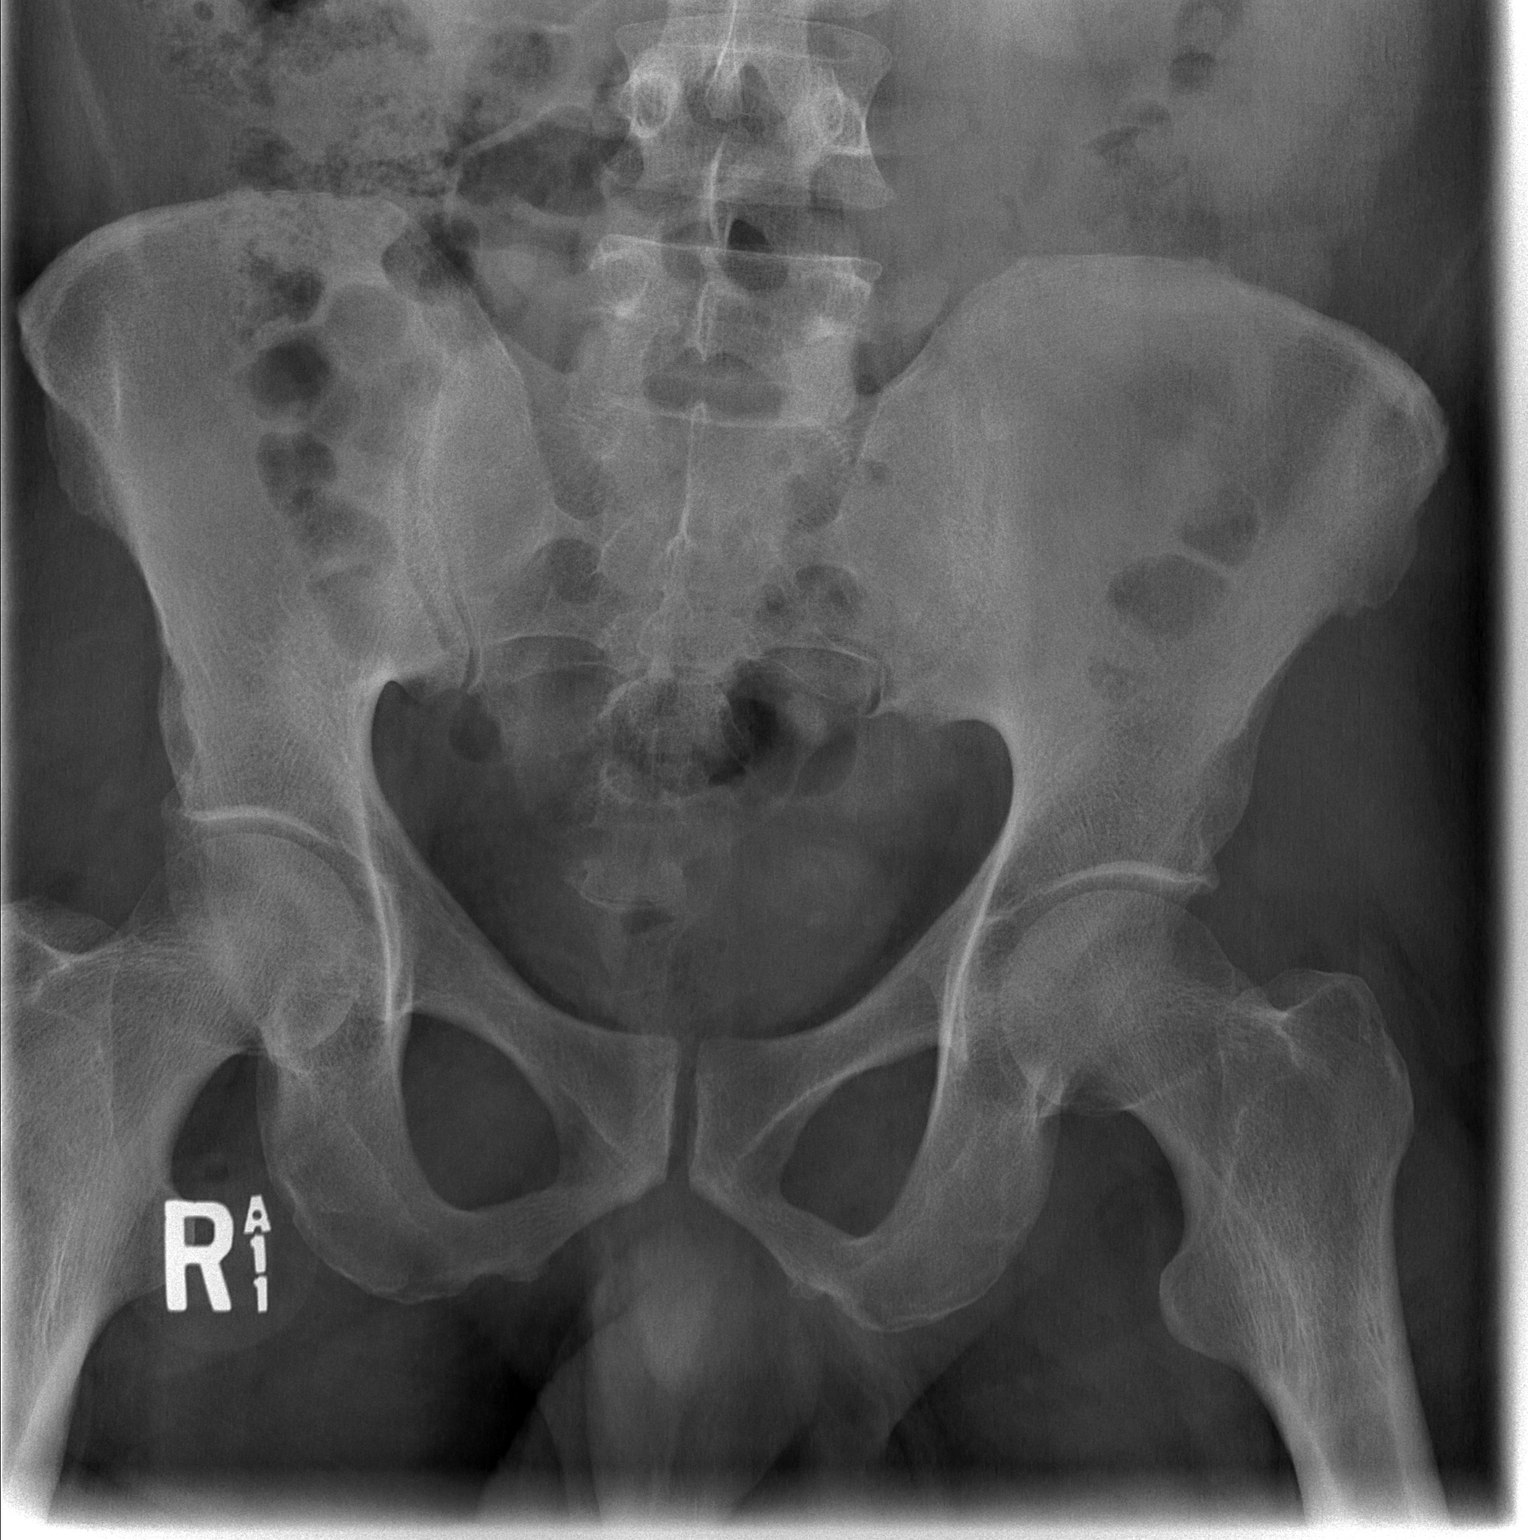

[2 of 2 positions shown; findings below may reference images not displayed]

FINDINGS: No nephrolithiasis. Persistent 4 mm calculus projecting along the
left aspect of the urinary bladder. The position appears unchanged
since the scout view on 09/18/2016.

No other urologic calculus identified. Normal bowel gas pattern.
Negative visible left lung base. Normal abdominal visceral contours.
No acute osseous abnormality identified.
IMPRESSION: 1. Unchanged since 09/18/2016 [DATE] mm calculus projecting at the left
UVJ.
2. No other urologic calculus identified.
